# Patient Record
Sex: Female | Born: 1980 | Race: White | Hispanic: No | Marital: Single | Smoking: Never smoker
Health system: Southern US, Community
[De-identification: ages and names within clinical notes are randomized; demographics above are authoritative.]

## PROBLEM LIST (undated history)

## (undated) DIAGNOSIS — I10 Essential (primary) hypertension: Secondary | ICD-10-CM

## (undated) DIAGNOSIS — D6862 Lupus anticoagulant syndrome: Secondary | ICD-10-CM

## (undated) DIAGNOSIS — D649 Anemia, unspecified: Secondary | ICD-10-CM

## (undated) HISTORY — PX: BREAST REDUCTION SURGERY: SHX8

## (undated) HISTORY — PX: OTHER SURGICAL HISTORY: SHX169

## (undated) HISTORY — PX: OVARIAN CYST REMOVAL: SHX89

## (undated) HISTORY — PX: CHOLECYSTECTOMY: SHX55

---

## 2013-07-23 ENCOUNTER — Encounter (HOSPITAL_COMMUNITY): Payer: Self-pay | Admitting: Emergency Medicine

## 2013-07-23 ENCOUNTER — Emergency Department (HOSPITAL_COMMUNITY)
Admission: EM | Admit: 2013-07-23 | Discharge: 2013-07-23 | Discharge status: Against Medical Advice | Payer: Managed Care, Other (non HMO) | Attending: Emergency Medicine | Admitting: Emergency Medicine

## 2013-07-23 DIAGNOSIS — R21 Rash and other nonspecific skin eruption: Secondary | ICD-10-CM | POA: Insufficient documentation

## 2013-07-23 DIAGNOSIS — I10 Essential (primary) hypertension: Secondary | ICD-10-CM | POA: Insufficient documentation

## 2013-07-23 HISTORY — DX: Anemia, unspecified: D64.9

## 2013-07-23 HISTORY — DX: Lupus anticoagulant syndrome: D68.62

## 2013-07-23 HISTORY — DX: Essential (primary) hypertension: I10

## 2013-07-23 NOTE — ED Notes (Signed)
Pt states she has a rash that started on her thighs and has spread  Pt states it itches  States it started about 3 weeks ago

## 2013-08-06 ENCOUNTER — Encounter (HOSPITAL_COMMUNITY): Payer: Self-pay | Admitting: Emergency Medicine

## 2013-08-06 ENCOUNTER — Emergency Department (HOSPITAL_COMMUNITY)
Admission: EM | Admit: 2013-08-06 | Discharge: 2013-08-07 | Disposition: A | Discharge status: Home / Self Care | Payer: Managed Care, Other (non HMO) | Attending: Emergency Medicine | Admitting: Emergency Medicine

## 2013-08-06 DIAGNOSIS — R5381 Other malaise: Secondary | ICD-10-CM | POA: Insufficient documentation

## 2013-08-06 DIAGNOSIS — I10 Essential (primary) hypertension: Secondary | ICD-10-CM | POA: Insufficient documentation

## 2013-08-06 DIAGNOSIS — R5383 Other fatigue: Secondary | ICD-10-CM | POA: Insufficient documentation

## 2013-08-06 DIAGNOSIS — J029 Acute pharyngitis, unspecified: Secondary | ICD-10-CM | POA: Insufficient documentation

## 2013-08-06 DIAGNOSIS — R110 Nausea: Secondary | ICD-10-CM | POA: Insufficient documentation

## 2013-08-06 DIAGNOSIS — Z880 Allergy status to penicillin: Secondary | ICD-10-CM | POA: Insufficient documentation

## 2013-08-06 DIAGNOSIS — K920 Hematemesis: Secondary | ICD-10-CM | POA: Insufficient documentation

## 2013-08-06 DIAGNOSIS — Z862 Personal history of diseases of the blood and blood-forming organs and certain disorders involving the immune mechanism: Secondary | ICD-10-CM | POA: Insufficient documentation

## 2013-08-06 NOTE — ED Notes (Signed)
Pt presents to the ED with symptoms resembling the flu. Pt has a cough, headache, weakness.  Pt denis nasal congestion. Pt has nausea for the past couple of weeks

## 2013-08-06 NOTE — ED Provider Notes (Signed)
CSN: [id number]     Arrival date & time 08/06/13  2259 History  This chart was scribed for non-physician practitioner Arman FilterGail K Atom Solivan, NP, working with Olivia Mackielga M Otter, MD, by Yevette EdwardsAngela Bracken, ED Scribe. This patient was seen in room WTR6/WTR6 and the patient's care was started at 11:25 PM.   First MD Initiated Contact with Patient 08/06/13 2322     Chief Complaint  Patient presents with  . Cough  . Weakness    The history is provided by the patient. No language interpreter was used.   HPI Comments: Kristen Tate is a 33 y.o. female who presents to the Emergency Department complaining of a sore throat which began yesterday evening and which has been associated with a headache, chills, hematemesis, and generalized weakness. She has experienced the headache for several days, and she has experienced a couple weeks of nausea. The pt has not treated her symptoms with anything. She denies nasal congestion, fever, or emesis. She has been exposed to the public.   Past Medical History  Diagnosis Date  . Hypertension   . Anemia   . Lupus anticoagulant disorder    Past Surgical History  Procedure Laterality Date  . Cholecystectomy    . Scar tissue removed     . Breast reduction surgery     Family History  Problem Relation Age of Onset  . Cancer Mother   . Hypertension Mother   . Diabetes Father   . Hypertension Father    History  Substance Use Topics  . Smoking status: Never Smoker   . Smokeless tobacco: Not on file  . Alcohol Use: No   No OB history provided.  Review of Systems  Constitutional: Positive for chills. Negative for fever.  HENT: Negative for congestion.   Respiratory: Positive for cough.   Gastrointestinal: Negative for vomiting.  Neurological: Positive for weakness and headaches.  All other systems reviewed and are negative.    Allergies  Penicillins  Home Medications   Current Outpatient Rx  Name  Route  Sig  Dispense  Refill  . ibuprofen (ADVIL,MOTRIN) 600  MG tablet   Oral   Take 1 tablet (600 mg total) by mouth every 6 (six) hours as needed.   30 tablet   0     Triage Vitals: BP 161/95  Pulse 91  Temp(Src) 98.8 F (37.1 C) (Oral)  Resp 20  Ht 5\' 11"  (1.803 m)  Wt 235 lb (106.595 kg)  BMI 32.79 kg/m2  SpO2 99%  LMP 06/12/2013  Physical Exam  Nursing note and vitals reviewed. Constitutional: She is oriented to person, place, and time. She appears well-developed and well-nourished. No distress.  HENT:  Head: Normocephalic and atraumatic.  Right Ear: External ear normal.  Left Ear: External ear normal.  Nose: Nose normal.  Mouth/Throat: Oropharynx is clear and moist. No oropharyngeal exudate.  Eyes: EOM are normal.  Neck: Neck supple. No tracheal deviation present.  No lymphadenopathy present, but the pt is tender.   Cardiovascular: Normal rate, regular rhythm and normal heart sounds.   No murmur heard. Pulmonary/Chest: Effort normal and breath sounds normal. No respiratory distress. She has no wheezes.  Musculoskeletal: Normal range of motion.  Neurological: She is alert and oriented to person, place, and time.  Skin: Skin is warm and dry.  Psychiatric: She has a normal mood and affect. Her behavior is normal.    ED Course  Procedures (including critical care time)  DIAGNOSTIC STUDIES: Oxygen Saturation is 99% on room air,  normal by my interpretation.    COORDINATION OF CARE:  11:29 PM- Discussed treatment plan with patient, which includes a strep culture, and the patient agreed to the plan.   Labs Review Labs Reviewed  RAPID STREP SCREEN  CULTURE, GROUP A STREP   Imaging Review No results found.   EKG Interpretation None     Strep test is negative Patient has been unable to produce any "bloody sputum"  MDM   Final diagnoses:  Pharyngitis      I personally performed the services described in this documentation, which was scribed in my presence. The recorded information has been reviewed and is  accurate.   Arman Filter, NP 08/07/13 [phone]

## 2013-08-07 LAB — RAPID STREP SCREEN (MED CTR MEBANE ONLY): STREPTOCOCCUS, GROUP A SCREEN (DIRECT): NEGATIVE

## 2013-08-07 MED ORDER — KETOROLAC TROMETHAMINE 15 MG/ML IJ SOLN
30.0000 mg | Freq: Once | INTRAMUSCULAR | Status: AC
  Administered 2013-08-07: 30 mg via INTRAMUSCULAR
  Filled 2013-08-07: qty 2

## 2013-08-07 MED ORDER — IBUPROFEN 600 MG PO TABS: 600.0000 mg | ORAL_TABLET | Freq: Four times a day (QID) | ORAL | Status: DC | PRN

## 2013-08-07 NOTE — Discharge Instructions (Signed)
Your strep test is negative. °

## 2013-08-07 NOTE — ED Provider Notes (Signed)
Medical screening examination/treatment/procedure(s) were performed by non-physician practitioner and as supervising physician I was immediately available for consultation/collaboration.   EKG Interpretation None       Fernado Brigante M Enrico Eaddy, MD 08/07/13 0714 

## 2013-08-08 LAB — CULTURE, GROUP A STREP

## 2013-09-21 ENCOUNTER — Encounter (HOSPITAL_COMMUNITY): Payer: Self-pay | Admitting: Emergency Medicine

## 2013-09-21 ENCOUNTER — Emergency Department (HOSPITAL_COMMUNITY)
Admission: EM | Admit: 2013-09-21 | Discharge: 2013-09-21 | Disposition: A | Discharge status: Home / Self Care | Payer: Managed Care, Other (non HMO) | Attending: Emergency Medicine | Admitting: Emergency Medicine

## 2013-09-21 DIAGNOSIS — I10 Essential (primary) hypertension: Secondary | ICD-10-CM | POA: Insufficient documentation

## 2013-09-21 DIAGNOSIS — Z880 Allergy status to penicillin: Secondary | ICD-10-CM | POA: Insufficient documentation

## 2013-09-21 DIAGNOSIS — L509 Urticaria, unspecified: Secondary | ICD-10-CM | POA: Insufficient documentation

## 2013-09-21 DIAGNOSIS — Z862 Personal history of diseases of the blood and blood-forming organs and certain disorders involving the immune mechanism: Secondary | ICD-10-CM | POA: Insufficient documentation

## 2013-09-21 DIAGNOSIS — R21 Rash and other nonspecific skin eruption: Secondary | ICD-10-CM | POA: Insufficient documentation

## 2013-09-21 MED ORDER — RANITIDINE HCL 150 MG PO TABS: 150.0000 mg | ORAL_TABLET | Freq: Two times a day (BID) | ORAL | Status: DC

## 2013-09-21 MED ORDER — FAMOTIDINE 20 MG PO TABS
20.0000 mg | ORAL_TABLET | Freq: Once | ORAL | Status: AC
  Administered 2013-09-21: 20 mg via ORAL
  Filled 2013-09-21: qty 1

## 2013-09-21 MED ORDER — PREDNISONE 20 MG PO TABS
60.0000 mg | ORAL_TABLET | Freq: Once | ORAL | Status: AC
  Administered 2013-09-21: 60 mg via ORAL
  Filled 2013-09-21: qty 3

## 2013-09-21 MED ORDER — DIPHENHYDRAMINE HCL 25 MG PO TABS: 25.0000 mg | ORAL_TABLET | Freq: Four times a day (QID) | ORAL | Status: DC | PRN

## 2013-09-21 MED ORDER — DIPHENHYDRAMINE HCL 25 MG PO CAPS
25.0000 mg | ORAL_CAPSULE | Freq: Once | ORAL | Status: AC
  Administered 2013-09-21: 25 mg via ORAL
  Filled 2013-09-21: qty 1

## 2013-09-21 MED ORDER — PREDNISONE 20 MG PO TABS: ORAL_TABLET | ORAL | Status: DC

## 2013-09-21 NOTE — ED Notes (Signed)
Pt c/o hives over upper body x 2 days; itching worse today

## 2013-09-21 NOTE — ED Provider Notes (Signed)
CSN: [id number]     Arrival date & time 09/21/13  0751 History   First MD Initiated Contact with Patient 09/21/13 0757     Chief Complaint  Patient presents with  . Urticaria     (Consider location/radiation/quality/duration/timing/severity/associated sxs/prior Treatment) HPI Comments: Denies new contacts, insect bites. She was on Bactrim for a skin infection, but finished this about 1 week ago.   Patient is a 33 y.o. female presenting with urticaria. The history is provided by the patient. No language interpreter was used.  Urticaria This is a new problem. The current episode started yesterday. The problem occurs constantly. The problem has been gradually worsening. Pertinent negatives include no chest pain, no abdominal pain, no headaches and no shortness of breath. Nothing aggravates the symptoms. Nothing relieves the symptoms. She has tried nothing for the symptoms. The treatment provided no relief.    Past Medical History  Diagnosis Date  . Hypertension   . Anemia   . Lupus anticoagulant disorder    Past Surgical History  Procedure Laterality Date  . Cholecystectomy    . Scar tissue removed     . Breast reduction surgery     Family History  Problem Relation Age of Onset  . Cancer Mother   . Hypertension Mother   . Diabetes Father   . Hypertension Father    History  Substance Use Topics  . Smoking status: Never Smoker   . Smokeless tobacco: Not on file  . Alcohol Use: No   OB History   Grav Para Term Preterm Abortions TAB SAB Ect Mult Living                 Review of Systems  Constitutional: Negative for fever, chills, diaphoresis, activity change, appetite change and fatigue.  HENT: Negative for congestion, facial swelling, rhinorrhea and sore throat.   Eyes: Negative for photophobia and discharge.  Respiratory: Negative for cough, chest tightness and shortness of breath.   Cardiovascular: Negative for chest pain, palpitations and leg swelling.    Gastrointestinal: Negative for nausea, vomiting, abdominal pain and diarrhea.  Endocrine: Negative for polydipsia and polyuria.  Genitourinary: Negative for dysuria, frequency, difficulty urinating and pelvic pain.  Musculoskeletal: Negative for arthralgias, back pain, neck pain and neck stiffness.  Skin: Positive for rash. Negative for color change and wound.  Allergic/Immunologic: Negative for immunocompromised state.  Neurological: Negative for facial asymmetry, weakness, numbness and headaches.  Hematological: Does not bruise/bleed easily.  Psychiatric/Behavioral: Negative for confusion and agitation.      Allergies  Penicillins  Home Medications   Prior to Admission medications   Medication Sig Start Date End Date Taking? Authorizing Provider  ibuprofen (ADVIL,MOTRIN) 600 MG tablet Take 1 tablet (600 mg total) by mouth every 6 (six) hours as needed. 08/07/13   Arman FilterGail K Schulz, NP   BP 157/97  Pulse 85  Temp(Src) 97.7 F (36.5 C) (Oral)  Resp 16  SpO2 100%  LMP 09/07/2013 Physical Exam  Constitutional: She is oriented to person, place, and time. She appears well-developed and well-nourished. No distress.  HENT:  Head: Normocephalic and atraumatic.  Mouth/Throat: No oropharyngeal exudate.  Eyes: Pupils are equal, round, and reactive to light.  Neck: Normal range of motion. Neck supple.  Cardiovascular: Normal rate, regular rhythm and normal heart sounds.  Exam reveals no gallop and no friction rub.   No murmur heard. Pulmonary/Chest: Effort normal and breath sounds normal. No respiratory distress. She has no wheezes. She has no rales.  Abdominal: Soft. Bowel  sounds are normal. She exhibits no distension and no mass. There is no tenderness. There is no rebound and no guarding.  Musculoskeletal: Normal range of motion. She exhibits no edema and no tenderness.  Neurological: She is alert and oriented to person, place, and time.  Skin: Skin is warm and dry. Rash noted. Rash is  urticarial.     Generalized urticarial rash  Psychiatric: She has a normal mood and affect.    ED Course  Procedures (including critical care time) Labs Review Labs Reviewed - No data to display  Imaging Review No results found.   EKG Interpretation None      MDM   Final diagnoses:  Urticaria    Pt is a 33 y.o. female with Pmhx as above who presents with 2 days of generalized pruritic urticaria of unknown cause.  No s/sx of anaphylaxis.  VSS, pt in NAD. Will treat w/ 5D benadryl, zantac, prednisone. Return precautions given for new or worsening symptoms including s/sx on anaphylaxis which were discussed. Resources given to est w/ PCP.         Shanna CiscoMegan E Docherty, MD 09/21/13 [phone]

## 2013-09-21 NOTE — Discharge Instructions (Signed)
Hives Hives are itchy, red, swollen areas of the skin. They can vary in size and location on your body. Hives can come and go for hours or several days (acute hives) or for several weeks (chronic hives). Hives do not spread from person to person (noncontagious). They may get worse with scratching, exercise, and emotional stress. CAUSES   Allergic reaction to food, additives, or drugs.  Infections, including the common cold.  Illness, such as vasculitis, lupus, or thyroid disease.  Exposure to sunlight, heat, or cold.  Exercise.  Stress.  Contact with chemicals. SYMPTOMS   Red or white swollen patches on the skin. The patches may change size, shape, and location quickly and repeatedly.  Itching.  Swelling of the hands, feet, and face. This may occur if hives develop deeper in the skin. DIAGNOSIS  Your caregiver can usually tell what is wrong by performing a physical exam. Skin or blood tests may also be done to determine the cause of your hives. In some cases, the cause cannot be determined. TREATMENT  Mild cases usually get better with medicines such as antihistamines. Severe cases may require an emergency epinephrine injection. If the cause of your hives is known, treatment includes avoiding that trigger.  HOME CARE INSTRUCTIONS   Avoid causes that trigger your hives.  Take antihistamines as directed by your caregiver to reduce the severity of your hives. Non-sedating or low-sedating antihistamines are usually recommended. Do not drive while taking an antihistamine.  Take any other medicines prescribed for itching as directed by your caregiver.  Wear loose-fitting clothing.  Keep all follow-up appointments as directed by your caregiver. SEEK MEDICAL CARE IF:   You have persistent or severe itching that is not relieved with medicine.  You have painful or swollen joints. SEEK IMMEDIATE MEDICAL CARE IF:   You have a fever.  Your tongue or lips are swollen.  You have  trouble breathing or swallowing.  You feel tightness in the throat or chest.  You have abdominal pain. These problems may be the first sign of a life-threatening allergic reaction. Call your local emergency services (911 in U.S.). MAKE SURE YOU:   Understand these instructions.  Will watch your condition.  Will get help right away if you are not doing well or get worse. Document Released: 05/18/2005 Document Revised: 11/17/2011 Document Reviewed: 08/11/2011 [facility] Patient Information 2014 [place], [place].   Emergency Department Resource Guide 1) Find a Doctor and Pay Out of Pocket Although you won't have to find out who is covered by your insurance plan, it is a good idea to ask around and get recommendations. You will then need to call the office and see if the doctor you have chosen will accept you as a new patient and what types of options they offer for patients who are self-pay. Some doctors offer discounts or will set up payment plans for their patients who do not have insurance, but you will need to ask so you aren't surprised when you get to your appointment.  2) Contact Your Local Health Department Not all health departments have doctors that can see patients for sick visits, but many do, so it is worth a call to see if yours does. If you don't know where your local health department is, you can check in your phone book. The CDC also has a tool to help you locate your state's health department, and many state websites also have listings of all of their local health departments.  3) Find a [employer]  If your illness is not likely to be very severe or complicated, you may want to try a walk in clinic. These are popping up all over the country in pharmacies, drugstores, and shopping centers. They're usually staffed by nurse practitioners or physician assistants that have been trained to treat common illnesses and complaints. They're usually fairly quick and inexpensive. However,  if you have serious medical issues or chronic medical problems, these are probably not your best option.  No Primary Care Doctor: - Call Health Connect at  [phone] - they can help you locate a primary care doctor that  accepts your insurance, provides certain services, etc. - Physician Referral Service- [phone]  Chronic Pain Problems: Organization         Address  Phone   Notes  [facility]  [phone] Patients need to be referred by their primary care doctor.   Medication Assistance: Organization         Address  Phone   Notes  [facility] Medication [facility] [address] [place], [place] [address] [phone] --Must be a resident of [facility] -- Must have NO insurance coverage whatsoever (no Medicaid/ Medicare, etc.) -- The pt. MUST have a primary care doctor that directs their care regularly and follows them in the community   [facility]  [phone]   [employer]  [phone]    Agencies that provide inexpensive medical care: Organization         Address  Phone   Notes  [facility]  [phone]   [facility]    [phone]   [facility] [address] [place], [place] [address] [phone]   [facility] [address] [place] [phone]   Planned Parenthood    [phone]   [facility]    [phone]   [facility]  [address], [place] Phone:  [phone], Fax:  [phone] Hours of Operation:  9 am - 6 pm, M-F.  Also accepts Medicaid/Medicare and self-pay.  [facility]  [address], [place] Phone: [phone], Fax: [phone]. Hours of Operation:  8:30 am - 5:30 pm, M-F.  Also accepts Medicaid and self-pay.  [facility] [place] [address] [place] Phone: [phone]   [facility] [address] Natasha Bence [place], [place] [phone], Ext. [phone] Mondays & Thursdays: 7-9 AM.  First 15 patients are seen on a first come, first serve basis.    Medicaid-accepting [facility] Providers:  Organization         Address  Phone   Notes  [facility] [address], [place] [phone] Also accepts self-pay patients.  [facility] [address] Laurell Josephs [place], [place]  [phone]   [facility] [address], [place] [phone]   [facility] Medicine [address] [place] [phone]   Renaye Rakers [address], [place]   [phone] Only accepts [place] patients after they have their name applied to their card.   Self-Pay (no insurance) in [facility]:  Organization         Address  Phone   Notes  Sickle Cell Patients, [facility] [address] [place], [place] [phone])  [phone]   [facility] [address], [place] [phone]   [facility]  [address], [place] [phone]   [facility]/Dr. Osei-Bonsu  [address] [place] [address], [place] or [phone], [place] [phone] Phone number for both [place] and [place] locations is the same.  [facility] [address], [place] [phone]   [facility] [address] [place] [address], [place] or [address] [phone]) [phone]   [facility] [address], [place] [phone]) [phone], phone; [phone](336) [phone], fax Sees patients 1st and 3rd Saturday of every month.  Must not qualify for public or private insurance (i.e. Medicaid, Medicare, [place] Health Choice, Veterans' Benefits)  Household income should be no more than 200% of the poverty level The clinic cannot treat you if you are pregnant or think you are  pregnant  Sexually transmitted diseases are not treated at the clinic.    Dental Care: Organization         Address  Phone  Notes  [facility] [address], [place] [phone]) [phone] Accepts children up to age 33 who are enrolled in [place] or [place] Health Choice; pregnant women with a Medicaid card; and children who have applied for Medicaid or [place] Health Choice, but were declined, whose parents can pay a reduced fee at time of service.  [facility] [place]  [address], [place] [phone] Accepts children up to age 33 who are enrolled in [place] or [place] Health Choice; pregnant women with a Medicaid card; and children who have applied for Medicaid or [place] Health Choice, but were declined, whose parents can pay a reduced fee at time of service.  [facility]  [address], [place] [phone]) [phone] Patients are seen by appointment only. Walk-ins are not accepted. [facility] will see patients 33 years of age and older. Monday - Tuesday (8am-5pm) Most Wednesdays (8:30-5pm) $30 per visit, cash only  [facility]  [address], [facility] [place] [phone] Patients are seen by appointment only. Walk-ins are not accepted. [facility] will see patients 33 years of age and older. One Wednesday Evening (Monthly: Volunteer Based).  $30 per visit, cash only  [facility] [facility]  [phone] for adults; Children under age 574, call Graduate Pediatric Dentistry at [phone]. Children aged 294-14, please call [phone] to request a pediatric application.  Dental services are provided in all areas of dental care including fillings, crowns and bridges, complete and partial dentures, implants, gum treatment, root canals, and extractions. Preventive care is also provided. Treatment is provided to both adults and  children. Patients are selected via a lottery and there is often a waiting list.   [facility] [address], [place]  [phone] [email or web address]   [facility] [address], [place], [place] [phone], Ext. [phone] Second and Fourth Thursday of each month, opens at 6:30 AM; Clinic ends at 9 AM.  Patients are seen on a first-come first-served basis, and a limited number are seen during each clinic.   [facility]  [address], [place], [place] [phone]) [phone]   Eligibility Requirements You must have lived in [place], [place], or [place] counties for  at least the last three months.   You cannot be eligible for state or federal sponsored [place] Cityhealthcare insurance, including CIGNAVeterans Administration, [place]icaid, or [employer]Medicare.   You generally cannot be eligible for healthcare insurance through your employer.    How to apply: Eligibility screenings are held every Tuesday and Wednesday afternoon from 1:00 pm until 4:00 pm. You do not need an appointment for the interview!  [facility] [address], [place], [place] [phone]   [facility]  [phone]   [facility]  [phone]   [facility]  [phone]    Behavioral Health Resources in the Community: Intensive Outpatient Programs Organization         Address  Phone  Notes  [facility] [address], [place], [place] [phone]   [facility] [address], [place], [place] [phone]   [facility] [address], [place], [place]  [phone]   [facility] [address],  [place], [place] [phone] or [phone]   Substance Abuse Resources Organization         Address  Phone  Notes  [facility]  [phone]   [facility]  [phone]   [facility]  [phone]    [facility]  [phone]   Residential & Outpatient Substance Abuse Program  [phone]   Psychological Services Organization         Address  Phone  Notes  [facility]  [phone]- [phone]   [facility]  [phone]- [phone]   [facility] [address], [place] [phone] or [phone]    Mobile Crisis Teams Organization         Address  Phone  Notes  [facility], Mobile Crisis Care Unit  [phone]   [facility]  [address] [place], [place] [phone]   Doristine LocksSharon DeEsch [address] [place] [phone]    Self-Help/Support Groups Organization         Address  Phone             Notes  [facility] [place] - variety of support groups  336- [phone] Call for more information  Narcotics Anonymous (NA), [facility] [address], [place]  2 meetings at this location   Statisticianesidential Treatment Programs Organization         Address  Phone  Notes  [facility] [address],    [place]  [phone]   [facility]  [address], [place] [phone], [place], [place] [phone]   [facility] [address], [place] [phone] Admissions: 8am-3pm M-F  [facility] [address]    [place], [place] [phone]   [facility] [address], [place], [place] [phone]   [facility] [address]  [place], [place] [phone]   [facility] - Intensive Outpatient [address], [place], [place] [phone]   [facility] [address]  [place], [place] [phone] or [phone]   [facility] ([facility]) [address] [place], [place] [phone] Accepts Medicaid  [facility] [address]  [place] [phone] Substance Abuse/Addiction Treatment   [facility]  Behavioral Health Resources Organization         Address  Phone  Notes  [facility]  [phone]  [phone]   °Julie Brannon, PhD [address] [place], [place]   [phone] or [phone]   °[facility]   [address] °[place], [place] [phone]   °[facility] [address], [place], [place] [phone] Insurance/Medicaid/sponsorship through [facility]  °[facility] [address]                                    [place], [place] [phone] Therapy/tele-psych/case  °[facility] [address]  ° [place], [place] [phone]    °Dr. Arfeen  [phone]   °[facility] [place]  [employer] [place] [facility] 1) [address], [place] °2) [address], [place] °3)  [address], [place] [phone] °([phone] ° °([phone]   °[place] Child Abuse Hotline [phone]) [phone] or [phone]) [phone] (After Hours)    ° ° ° °

## 2013-10-19 ENCOUNTER — Emergency Department (HOSPITAL_COMMUNITY)
Admission: EM | Admit: 2013-10-19 | Discharge: 2013-10-19 | Disposition: A | Discharge status: Home / Self Care | Payer: Managed Care, Other (non HMO) | Attending: Emergency Medicine | Admitting: Emergency Medicine

## 2013-10-19 ENCOUNTER — Encounter (HOSPITAL_COMMUNITY): Payer: Self-pay | Admitting: Emergency Medicine

## 2013-10-19 DIAGNOSIS — Z880 Allergy status to penicillin: Secondary | ICD-10-CM | POA: Insufficient documentation

## 2013-10-19 DIAGNOSIS — I10 Essential (primary) hypertension: Secondary | ICD-10-CM | POA: Insufficient documentation

## 2013-10-19 DIAGNOSIS — R21 Rash and other nonspecific skin eruption: Secondary | ICD-10-CM | POA: Insufficient documentation

## 2013-10-19 DIAGNOSIS — Z202 Contact with and (suspected) exposure to infections with a predominantly sexual mode of transmission: Secondary | ICD-10-CM | POA: Insufficient documentation

## 2013-10-19 DIAGNOSIS — Z862 Personal history of diseases of the blood and blood-forming organs and certain disorders involving the immune mechanism: Secondary | ICD-10-CM | POA: Insufficient documentation

## 2013-10-19 DIAGNOSIS — Z3202 Encounter for pregnancy test, result negative: Secondary | ICD-10-CM | POA: Insufficient documentation

## 2013-10-19 LAB — WET PREP, GENITAL: Yeast Wet Prep HPF POC: NONE SEEN

## 2013-10-19 LAB — HIV ANTIBODY (ROUTINE TESTING W REFLEX): HIV 1&2 Ab, 4th Generation: NONREACTIVE

## 2013-10-19 LAB — PREGNANCY, URINE: Preg Test, Ur: NEGATIVE

## 2013-10-19 LAB — GC/CHLAMYDIA PROBE AMP
CT Probe RNA: NEGATIVE
GC PROBE AMP APTIMA: NEGATIVE

## 2013-10-19 LAB — RPR

## 2013-10-19 MED ORDER — DOXYCYCLINE HYCLATE 100 MG PO TABS: 100.0000 mg | ORAL_TABLET | Freq: Once | ORAL | Status: DC

## 2013-10-19 MED ORDER — PREDNISONE 20 MG PO TABS
60.0000 mg | ORAL_TABLET | Freq: Once | ORAL | Status: AC
  Administered 2013-10-19: 60 mg via ORAL
  Filled 2013-10-19: qty 3

## 2013-10-19 MED ORDER — AZITHROMYCIN 250 MG PO TABS
1000.0000 mg | ORAL_TABLET | Freq: Every day | ORAL | Status: DC
  Administered 2013-10-19: 1000 mg via ORAL
  Filled 2013-10-19: qty 4

## 2013-10-19 MED ORDER — DOXYCYCLINE HYCLATE 100 MG PO TABS: 100.0000 mg | ORAL_TABLET | Freq: Two times a day (BID) | ORAL | Status: AC

## 2013-10-19 NOTE — ED Notes (Signed)
Pt left prior to receiving discharge papers, prior to discharge vitals, and prior to receiving medications that were due at discharge.

## 2013-10-19 NOTE — ED Provider Notes (Signed)
CSN: [id number]     Arrival date & time 10/19/13  0019 History   First MD Initiated Contact with Patient 10/19/13 0056     Chief Complaint  Patient presents with  . Wound Check     (Consider location/radiation/quality/duration/timing/severity/associated sxs/prior Treatment) HPI Comments: Started with hives 2 weeks ago that has progressed to lesions that are itch and patient has been unable to stop picking at them. Also wants to be checked for STD as previous boyfriend, who she had intercourse with 1 week ago called and told her he had chlamydia.  Patient is a 33 y.o. female presenting with wound check. The history is provided by the patient.  Wound Check The current episode started 1 to 4 weeks ago. The problem occurs constantly. The problem has been unchanged. Pertinent negatives include no fever or headaches. Nothing aggravates the symptoms. Treatments tried: benadryl. The treatment provided no relief.    Past Medical History  Diagnosis Date  . Hypertension   . Anemia   . Lupus anticoagulant disorder    Past Surgical History  Procedure Laterality Date  . Cholecystectomy    . Scar tissue removed     . Breast reduction surgery     Family History  Problem Relation Age of Onset  . Cancer Mother   . Hypertension Mother   . Diabetes Father   . Hypertension Father    History  Substance Use Topics  . Smoking status: Never Smoker   . Smokeless tobacco: Not on file  . Alcohol Use: No   OB History   Grav Para Term Preterm Abortions TAB SAB Ect Mult Living                 Review of Systems  Constitutional: Negative for fever.  Genitourinary: Positive for vaginal discharge. Negative for dysuria.  Skin: Positive for wound.  Neurological: Negative for dizziness and headaches.      Allergies  Penicillins  Home Medications   Prior to Admission medications   Medication Sig Start Date End Date Taking? Authorizing Provider  diphenhydrAMINE (BENADRYL) 25 MG tablet Take 25  mg by mouth at bedtime as needed for itching. 09/21/13  Yes Shanna CiscoMegan E Docherty, MD   BP 142/97  Pulse 92  Temp(Src) 98.4 F (36.9 C) (Oral)  Resp 18  SpO2 100%  LMP 09/27/2013 Physical Exam  Nursing note and vitals reviewed. Constitutional: She appears well-nourished.  HENT:  Head: Normocephalic.  Eyes: Pupils are equal, round, and reactive to light.  Cardiovascular: Normal rate and regular rhythm.   Pulmonary/Chest: Effort normal.  Abdominal: Soft. She exhibits no distension. There is no tenderness.  Genitourinary: Uterus is not enlarged and not tender. Cervix exhibits discharge. Right adnexum displays no tenderness. Left adnexum displays no tenderness. No tenderness or bleeding around the vagina. Vaginal discharge found.  Musculoskeletal: Normal range of motion.  Neurological: She is alert.  Skin:  Multiple scabbed over lesions on legs, arms, chest, and abdomen, and across the top of the shoulders, and he placed.  The patient.  Can reach to scratch there are no lesions.  On the lower back    ED Course  Procedures (including critical care time) Labs Review Labs Reviewed  WET PREP, GENITAL - Abnormal; Notable for the following:    Trich, Wet Prep MODERATE (*)    Clue Cells Wet Prep HPF POC RARE (*)    WBC, Wet Prep HPF POC MODERATE (*)    All other components within normal limits  GC/CHLAMYDIA PROBE AMP  PREGNANCY, URINE  RPR  HIV ANTIBODY (ROUTINE TESTING)    Imaging Review No results found.   EKG Interpretation None      MDM   Final diagnoses:  Rash  Exposure to STD        Arman FilterGail K Mehlani Blankenburg, NP 10/19/13 [id number]

## 2013-10-19 NOTE — Discharge Instructions (Signed)
Your cultures are pending Please take the antibiotic as directed until completed

## 2013-10-19 NOTE — ED Provider Notes (Signed)
Medical screening examination/treatment/procedure(s) were performed by non-physician practitioner and as supervising physician I was immediately available for consultation/collaboration.   EKG Interpretation None       Olivia Mackielga M Jayan Raymundo, MD 10/19/13 [phone]

## 2013-10-19 NOTE — ED Notes (Signed)
Pt presents with scabbed wounds to arms, legs and torso x 2 weeks. Pt states they began as hives

## 2013-10-30 ENCOUNTER — Telehealth (HOSPITAL_BASED_OUTPATIENT_CLINIC_OR_DEPARTMENT_OTHER): Payer: Self-pay | Admitting: Emergency Medicine

## 2014-10-06 ENCOUNTER — Encounter (HOSPITAL_BASED_OUTPATIENT_CLINIC_OR_DEPARTMENT_OTHER): Payer: Self-pay

## 2014-10-06 ENCOUNTER — Emergency Department (HOSPITAL_BASED_OUTPATIENT_CLINIC_OR_DEPARTMENT_OTHER)
Admission: EM | Admit: 2014-10-06 | Discharge: 2014-10-06 | Disposition: A | Discharge status: Home / Self Care | Payer: Managed Care, Other (non HMO) | Attending: Emergency Medicine | Admitting: Emergency Medicine

## 2014-10-06 DIAGNOSIS — D649 Anemia, unspecified: Secondary | ICD-10-CM | POA: Insufficient documentation

## 2014-10-06 DIAGNOSIS — Z792 Long term (current) use of antibiotics: Secondary | ICD-10-CM | POA: Insufficient documentation

## 2014-10-06 DIAGNOSIS — Z79899 Other long term (current) drug therapy: Secondary | ICD-10-CM | POA: Insufficient documentation

## 2014-10-06 DIAGNOSIS — Z3202 Encounter for pregnancy test, result negative: Secondary | ICD-10-CM | POA: Insufficient documentation

## 2014-10-06 DIAGNOSIS — R55 Syncope and collapse: Secondary | ICD-10-CM

## 2014-10-06 DIAGNOSIS — I10 Essential (primary) hypertension: Secondary | ICD-10-CM | POA: Insufficient documentation

## 2014-10-06 DIAGNOSIS — Z880 Allergy status to penicillin: Secondary | ICD-10-CM | POA: Insufficient documentation

## 2014-10-06 LAB — BASIC METABOLIC PANEL
Anion gap: 7 (ref 5–15)
BUN: 12 mg/dL (ref 6–20)
CALCIUM: 8 mg/dL — AB (ref 8.9–10.3)
CO2: 25 mmol/L (ref 22–32)
CREATININE: 1.05 mg/dL — AB (ref 0.44–1.00)
Chloride: 108 mmol/L (ref 101–111)
GFR calc Af Amer: >60 mL/min (ref >60)
GLUCOSE: 106 mg/dL — AB (ref 70–99)
Potassium: 3.6 mmol/L (ref 3.5–5.1)
Sodium: 140 mmol/L (ref 135–145)

## 2014-10-06 LAB — CBC
HCT: 32.3 % — ABNORMAL LOW (ref 36.0–46.0)
HEMOGLOBIN: 9.6 g/dL — AB (ref 12.0–15.0)
MCH: 18.7 pg — ABNORMAL LOW (ref 26.0–34.0)
MCHC: 29.7 g/dL — ABNORMAL LOW (ref 30.0–36.0)
MCV: 63 fL — ABNORMAL LOW (ref 78.0–100.0)
Platelets: 388 10*3/uL (ref 150–400)
RBC: 5.13 MIL/uL — ABNORMAL HIGH (ref 3.87–5.11)
RDW: 19.5 % — ABNORMAL HIGH (ref 11.5–15.5)
WBC: 12.3 10*3/uL — AB (ref 4.0–10.5)

## 2014-10-06 LAB — PREGNANCY, URINE: PREG TEST UR: NEGATIVE

## 2014-10-06 MED ORDER — FERROUS SULFATE 325 (65 FE) MG PO TABS: 325.0000 mg | ORAL_TABLET | Freq: Every day | ORAL | Status: AC

## 2014-10-06 NOTE — ED Provider Notes (Signed)
CSN: [id number]     Arrival date & time 10/06/14  1703 History   First MD Initiated Contact with Patient 10/06/14 1728     Chief Complaint  Patient presents with  . Loss of Consciousness     (Consider location/radiation/quality/duration/timing/severity/associated sxs/prior Treatment) HPI  Kristen Tate is a 34 y.o. female with PMH of HTN, anemia, lupus presenting with syncopal episode while at work today. Pt states she was standing and felt flushed and lightheaded with bilateral numbness tingling in fingers and walked to bathroom to splash water on her face and loss consciousness. Episode was witnessed at work staff. No head injury. Patient does report giving plasma today without eating or drinking. Patient endorses mild headache that developed gradually and is like other headaches he's had before. No numbness, tingling, weakness. Patient with mild bilateral neck pain. No chest pain or shortness of breath. Patient denies any nausea or vomiting abdominal back pain. No change in medications or new medications.   Past Medical History  Diagnosis Date  . Hypertension   . Anemia   . Lupus anticoagulant disorder    Past Surgical History  Procedure Laterality Date  . Cholecystectomy    . Scar tissue removed     . Breast reduction surgery     Family History  Problem Relation Age of Onset  . Cancer Mother   . Hypertension Mother   . Diabetes Father   . Hypertension Father    History  Substance Use Topics  . Smoking status: Never Smoker   . Smokeless tobacco: Not on file  . Alcohol Use: No   OB History    No data available     Review of Systems 10 Systems reviewed and are negative for acute change except as noted in the HPI.    Allergies  Penicillins  Home Medications   Prior to Admission medications   Medication Sig Start Date End Date Taking? Authorizing Provider  LISINOPRIL PO Take by mouth.   Yes Historical Provider, MD  diphenhydrAMINE (BENADRYL) 25 MG tablet Take  25 mg by mouth at bedtime as needed for itching. 09/21/13   Toy CookeyMegan Docherty, MD  doxycycline (VIBRA-TABS) 100 MG tablet Take 1 tablet (100 mg total) by mouth 2 (two) times daily. 10/19/13   Earley FavorGail Schulz, NP  ferrous sulfate 325 (65 FE) MG tablet Take 1 tablet (325 mg total) by mouth daily. 10/06/14   Oswaldo ConroyVictoria Lenea Bywater, PA-C   BP 108/75 mmHg  Pulse 77  Temp(Src) 98.3 F (36.8 C) (Oral)  Resp 14  Ht 5\' 10"  (1.778 m)  Wt 230 lb (104.327 kg)  BMI 33.00 kg/m2  SpO2 100% Physical Exam  Constitutional: She appears well-developed and well-nourished. No distress.  HENT:  Head: Normocephalic and atraumatic.  Mouth/Throat: Oropharynx is clear and moist.  Eyes: Conjunctivae and EOM are normal. Pupils are equal, round, and reactive to light. Right eye exhibits no discharge. Left eye exhibits no discharge.  Neck: Normal range of motion. Neck supple.  No nuchal rigidity  Cardiovascular: Normal rate and regular rhythm.   Pulmonary/Chest: Effort normal and breath sounds normal. No respiratory distress. She has no wheezes.  Abdominal: Soft. Bowel sounds are normal. She exhibits no distension. There is no tenderness.  Neurological: She is alert. No cranial nerve deficit. Coordination normal.  Speech is clear and goal oriented. Peripheral visual fields intact. Strength 5/5 in upper and lower extremities. Sensation intact. Intact rapid alternating movements, finger to nose, and heel to shin. Negative Romberg. No pronator drift. Normal gait.  Skin: Skin is warm and dry. She is not diaphoretic.  Nursing note and vitals reviewed.   ED Course  Procedures (including critical care time) Labs Review Labs Reviewed  CBC - Abnormal; Notable for the following:    WBC 12.3 (*)    RBC 5.13 (*)    Hemoglobin 9.6 (*)    HCT 32.3 (*)    MCV 63.0 (*)    MCH 18.7 (*)    MCHC 29.7 (*)    RDW 19.5 (*)    All other components within normal limits  BASIC METABOLIC PANEL - Abnormal; Notable for the following:    Glucose,  Bld 106 (*)    Creatinine, Ser 1.05 (*)    Calcium 8.0 (*)    All other components within normal limits  PREGNANCY, URINE    Imaging Review No results found.   EKG Interpretation None      MDM   Final diagnoses:  Syncope, unspecified syncope type  Anemia, unspecified anemia type   Patient presenting after syncopal episode without any current symptoms. VSS. No hypotension in ED. Pt given 1 L NS. Neurological exam without deficits. Pt with syncopal episode after anorexia and giving plasma which are likely the cause. Pt also with anemia which is chronic for her per patient. Pt given iron supplementation. I doubt this is contributing. Pt denies heavy vaginal bleeding, melenotic stool, hematemesis. Pt not pregnant. EKG without evidence of ischemia or arrhythmia. Pt states she feels much better after eating and drinking in ED. Pt well appearing, nontoxic and stable for discharge with PCP follow up. Pt given referral to [facility].  Discussed return precautions with patient. Discussed all results and patient verbalizes understanding and agrees with plan.  Filed Vitals:   10/06/14 1711 10/06/14 1916 10/06/14 1934  BP: 117/71 108/75 113/74  Pulse: 78 77 89  Temp: 97.6 F (36.4 C) 98.3 F (36.8 C)   TempSrc: Oral Oral   Resp: 14  18  Height: 5\' 10"  (1.778 m)    Weight: 230 lb (104.327 kg)    SpO2: 100% 100% 100%        Oswaldo ConroyVictoria Nylia Gavina, PA-C 10/06/14 1936  Raeford RazorStephen Kohut, MD 10/06/14 2023

## 2014-10-06 NOTE — Discharge Instructions (Signed)
Return to the emergency room with worsening of symptoms, new symptoms or with symptoms that are concerning , especially severe worsening of headache, visual or speech changes, weakness in face, arms or legs. Take iron pills daily. If it upsets your stomach and causes constipation take every other day. Please call your doctor for a followup appointment within 24-48 hours. When you talk to your doctor please let them know that you were seen in the emergency department and have them acquire all of your records so that they can discuss the findings with you and formulate a treatment plan to fully care for your new and ongoing problems. If you do not have a primary care provider please call the number below under ED resources to establish care with a provider and follow up.   Syncope Syncope is a medical term for fainting or passing out. This means you lose consciousness and drop to the ground. People are generally unconscious for less than 5 minutes. You may have some muscle twitches for up to 15 seconds before waking up and returning to normal. Syncope occurs more often in older adults, but it can happen to anyone. While most causes of syncope are not dangerous, syncope can be a sign of a serious medical problem. It is important to seek medical care.  CAUSES  Syncope is caused by a sudden drop in blood flow to the brain. The specific cause is often not determined. Factors that can bring on syncope include:  Taking medicines that lower blood pressure.  Sudden changes in posture, such as standing up quickly.  Taking more medicine than prescribed.  Standing in one place for too long.  Seizure disorders.  Dehydration and excessive exposure to heat.  Low blood sugar (hypoglycemia).  Straining to have a bowel movement.  Heart disease, irregular heartbeat, or other circulatory problems.  Fear, emotional distress, seeing blood, or severe pain. SYMPTOMS  Right before fainting, you may:  Feel dizzy  or light-headed.  Feel nauseous.  See all white or all black in your field of vision.  Have cold, clammy skin. DIAGNOSIS  Your health care provider will ask about your symptoms, perform a physical exam, and perform an electrocardiogram (ECG) to record the electrical activity of your heart. Your health care provider may also perform other heart or blood tests to determine the cause of your syncope which may include:  Transthoracic echocardiogram (TTE). During echocardiography, sound waves are used to evaluate how blood flows through your heart.  Transesophageal echocardiogram (TEE).  Cardiac monitoring. This allows your health care provider to monitor your heart rate and rhythm in real time.  Holter monitor. This is a portable device that records your heartbeat and can help diagnose heart arrhythmias. It allows your health care provider to track your heart activity for several days, if needed.  Stress tests by exercise or by giving medicine that makes the heart beat faster. TREATMENT  In most cases, no treatment is needed. Depending on the cause of your syncope, your health care provider may recommend changing or stopping some of your medicines. HOME CARE INSTRUCTIONS  Have someone stay with you until you feel stable.  Do not drive, use machinery, or play sports until your health care provider says it is okay.  Keep all follow-up appointments as directed by your health care provider.  Lie down right away if you start feeling like you might faint. Breathe deeply and steadily. Wait until all the symptoms have passed.  Drink enough fluids to keep your  urine clear or pale yellow.  If you are taking blood pressure or heart medicine, get up slowly and take several minutes to sit and then stand. This can reduce dizziness. SEEK IMMEDIATE MEDICAL CARE IF:   You have a severe headache.  You have unusual pain in the chest, abdomen, or back.  You are bleeding from your mouth or rectum, or  you have black or tarry stool.  You have an irregular or very fast heartbeat.  You have pain with breathing.  You have repeated fainting or seizure-like jerking during an episode.  You faint when sitting or lying down.  You have confusion.  You have trouble walking.  You have severe weakness.  You have vision problems. If you fainted, call your local emergency services (911 in U.S.). Do not drive yourself to the hospital.  MAKE SURE YOU:  Understand these instructions.  Will watch your condition.  Will get help right away if you are not doing well or get worse. Document Released: 05/18/2005 Document Revised: 05/23/2013 Document Reviewed: 07/17/2011 [facility] Patient Information 2015 [place], [place]. This information is not intended to replace advice given to you by your health care provider. Make sure you discuss any questions you have with your health care provider.    Emergency Department Resource Guide 1) Find a Doctor and Pay Out of Pocket Although you won't have to find out who is covered by your insurance plan, it is a good idea to ask around and get recommendations. You will then need to call the office and see if the doctor you have chosen will accept you as a new patient and what types of options they offer for patients who are self-pay. Some doctors offer discounts or will set up payment plans for their patients who do not have insurance, but you will need to ask so you aren't surprised when you get to your appointment.  2) Contact Your Local Health Department Not all health departments have doctors that can see patients for sick visits, but many do, so it is worth a call to see if yours does. If you don't know where your local health department is, you can check in your phone book. The CDC also has a tool to help you locate your state's health department, and many state websites also have listings of all of their local health departments.  3) Find a Walk-in Clinic If  your illness is not likely to be very severe or complicated, you may want to try a walk in clinic. These are popping up all over the country in pharmacies, drugstores, and shopping centers. They're usually staffed by nurse practitioners or physician assistants that have been trained to treat common illnesses and complaints. They're usually fairly quick and inexpensive. However, if you have serious medical issues or chronic medical problems, these are probably not your best option.  No Primary Care Doctor: - Call Health Connect at  [phone] - they can help you locate a primary care doctor that  accepts your insurance, provides certain services, etc. - Physician Referral Service- [phone]  Chronic Pain Problems: Organization         Address  Phone   Notes  [facility]  [phone] Patients need to be referred by their primary care doctor.   Medication Assistance: Organization         Address  Phone   Notes  [facility] Medication [facility] [address] [place], [place] [address] [phone] --Must be  a resident of [facility] -- Must have NO insurance coverage whatsoever (no Medicaid/ Medicare, etc.) -- The pt. MUST have a primary care doctor that directs their care regularly and follows them in the community   [facility]  [phone]   [employer]  [phone]    Agencies that provide inexpensive medical care: Organization         Address  Phone   Notes  [facility]  [phone]   [facility]    [phone]   [facility] [address] [place], [place] [address] [phone]   [facility] [address], [place] [phone]   Planned Parenthood    [phone]   [facility]    [phone]   [facility]  [address], [place] Phone:  [phone], Fax:  [phone] Hours of  Operation:  9 am - 6 pm, M-F.  Also accepts Medicaid/Medicare and self-pay.  [facility]  [address],  Phone: [phone], Fax: [phone]. Hours of Operation:  8:30 am - 5:30 pm, M-F.  Also accepts Medicaid and self-pay.  [facility] [place] [address] [place] Phone: [phone]   [facility] [address] Natasha Bence [place], [place] [phone], Ext. [phone] Mondays & Thursdays: 7-9 AM.  First 15 patients are seen on a first come, first serve basis.    Medicaid-accepting [facility] Providers:  Organization         Address  Phone   Notes  [facility] [address],  [phone] Also accepts self-pay patients.  [facility] [address] Laurell Josephs [place], [place]  [phone]   [facility] [address], [place] [phone]   [facility] [address], [place] [phone]   Renaye Rakers [address], [place]   [phone] Only accepts [place] patients after they have their name applied to their card.   Self-Pay (no insurance) in [facility]:  Organization         Address  Phone   Notes  Sickle Cell Patients, [facility] [address] [place], [place] [phone]   [facility] [address] [place], [place] [phone]   [facility] [place]  [address], [place] [phone]   [facility]/Dr. Osei-Bonsu  [address], [place] or [address], [place] [phone] Phone number for both [place] and [place] locations is the same.  Urgent Medical and [facility] [address], [place] [phone]   [facility] [address] [place] or [address] [phone]   [facility] [address], [place] [phone], phone; [phone], fax Sees patients 1st and 3rd Saturday of every month.  Must not qualify for public or private insurance (i.e. Medicaid, Medicare, [place] Health Choice, Veterans' Benefits)  Household income should be no more than 200% of the poverty level The clinic cannot treat you if you are pregnant or think you are pregnant  Sexually transmitted diseases are not treated at the clinic.    Dental Care: Organization  Address  Phone  Notes  [facility] [address], [place] [phone] Accepts children up to age 221 who are enrolled in [place]icaid or [place] Health Choice; pregnant women with a Medicaid card; and children who have applied for Medicaid or [place] Health Choice, but were declined, whose parents can pay a reduced fee at time of service.  [facility] [place]  [address], [place] [phone] Accepts children up to age 34 who are enrolled in [place] or [place] Health Choice; pregnant women with a Medicaid card; and children who have applied for Medicaid or [place] Health Choice, but were declined, whose parents can pay a reduced fee at time of service.  [facility]  [address], [place] [phone] Patients are seen by appointment only. Walk-ins are not accepted. [facility] will see patients 34 years of age and older. Monday - Tuesday (8am-5pm) Most Wednesdays (8:30-5pm) $30 per visit, cash only  [facility]  [address], [facility] [phone] Patients are seen by appointment only. Walk-ins are not accepted. [facility] will see patients 34 years of age and older. One Wednesday Evening (Monthly: Volunteer Based).  $30 per visit, cash only  [employer] [facility]  [phone] for adults; Children under age 544, call Graduate Pediatric  Dentistry at [phone]. Children aged 674-14, please call [phone] to request a pediatric application.  Dental services are provided in all areas of dental care including fillings, crowns and bridges, complete and partial dentures, implants, gum treatment, root canals, and extractions. Preventive care is also provided. Treatment is provided to both adults and children. Patients are selected via a lottery and there is often a waiting list.   [facility] [address], [place]  [phone] [email or web address]   [facility] [address], [place], [place] [phone], Ext. [phone] Second and Fourth Thursday of each month, opens at 6:30 AM; Clinic ends at 9 AM.  Patients are seen on a first-come first-served basis, and a limited number are seen during each clinic.   [facility]  [address], [place], [place] [phone]   Eligibility Requirements You must have lived in [place], [place], or [place] counties for at least the last three months.   You cannot be eligible for state or federal sponsored National Cityhealthcare insurance, including CIGNAVeterans Administration, [place]icaid, or [employer]icare.   You generally cannot be eligible for healthcare insurance through your employer.    How to apply: Eligibility screenings are held every Tuesday and Wednesday afternoon from 1:00 pm until 4:00 pm. You do not need an appointment for the interview!  [facility] [address], [place], [place] [phone]   [facility]  [phone]   [facility]  [phone]   [facility]  [phone]    Behavioral Health Resources in the Community: Intensive Outpatient Programs Organization         Address  Phone  Notes  [facility] [address], [place], [place] [phone]   [facility] [address], [place], [place] [phone]   ADS:  [facility] [address], [place], [place]  [phone]   [facility] [address],  [place], [place] [phone] or [phone]   Substance Abuse Resources  Organization         Address  Phone  Notes  [facility]  [phone]   [facility]  [phone]   [facility]  [phone]   [facility]  [phone]   Residential & Outpatient Substance Abuse Program  [phone]   Psychological Services Organization         Address  Phone  Notes  [facility]  [phone]- [phone]   [facility]  [phone]- [phone]   [facility] [address], [place] [phone] or [phone]    Mobile Crisis Teams Organization         Address  Phone  Notes  [facility], Mobile Crisis Care Unit  [phone]   [facility]  [address] [place], [place] [phone]   Doristine LocksSharon DeEsch [address] [place] [phone]    Self-Help/Support Groups Organization         Address  Phone             Notes  Mental Health Assoc. of  - variety of support groups  336- [phone] Call for more information  Narcotics Anonymous (NA), [facility] [address], [place]  2 meetings at this location   Statisticianesidential Treatment Programs Organization         Address  Phone  Notes  [facility] [address],    [place]  [phone]   [facility]  [address], [place] [phone], [place], [place] [phone]   [facility] [address], [place] [phone] Admissions: 8am-3pm M-F  [facility] [address]    [place], [place] [phone]   [facility] [address], [place], [place] [phone]   [facility] [address]  [place], [place] [phone]   [facility] - Intensive Outpatient [address],  [place], [place] [phone]   [facility] ([facility] [address]  [place], [place] [phone] or [phone]   [facility] ([facility]) [address] [place], [place] [phone] Accepts Medicaid  [facility] [address]  [place] [phone] Substance Abuse/Addiction Treatment   [facility] Resources Organization         Address  Phone  Notes  [facility]  [phone]   Angie FavaJulie Brannon, PhD [address], [address] [place], [place]   [phone] or [phone]   [facility]   [address] [place], [place] [phone]   [facility] [address], [place], [place] [phone] Insurance/Medicaid/sponsorship through [facility]  [facility] Families [address]                                    [place], [place] [phone] Therapy/tele-psych/case  [facility] [address]   [place], [place] [phone]    Dr. Lolly MustacheArfeen  [phone]   [facility]  [employer] [facility] 1) [address], [place] 2) [address], [place] 3)  [address], [place] [phone]  [phone]   [facility] Child Abuse Hotline [phone] or [phone] (After Hours)

## 2014-10-06 NOTE — ED Notes (Addendum)
EMS reports patient gave plasma today without eating or drinking and had a witnessed syncopal episode at work - denies neck, back pain, does c/o bilateral shoulder tenderness. Initially hypotensive - pt given 1L NS via 18G RAC placed by EMS. CBG 112.

## 2014-10-06 NOTE — ED Provider Notes (Signed)
Medical screening examination/treatment/procedure(s) were performed by non-physician practitioner and as supervising physician I was immediately available for consultation/collaboration.   EKG Interpretation None     EKG:  Rhythm: normal sinus Vent. rate 81 BPM PR interval 172 ms QRS duration 86 ms QT/QTc 406/471 ms ST segments: normal   Raeford RazorStephen Trampus Mcquerry, MD 10/06/14 1836

## 2016-12-20 ENCOUNTER — Encounter (HOSPITAL_COMMUNITY): Payer: Self-pay | Admitting: Emergency Medicine

## 2016-12-20 DIAGNOSIS — Z79899 Other long term (current) drug therapy: Secondary | ICD-10-CM | POA: Insufficient documentation

## 2016-12-20 DIAGNOSIS — M79604 Pain in right leg: Secondary | ICD-10-CM | POA: Insufficient documentation

## 2016-12-20 DIAGNOSIS — I10 Essential (primary) hypertension: Secondary | ICD-10-CM | POA: Insufficient documentation

## 2016-12-20 NOTE — ED Triage Notes (Signed)
Pt presents to ED for assessment of right leg pain after bending down to pick up a 25pack of water today.  Pt states she felt a sharp pop/tear in the back of her calf, and has been having difficulty ambulating on her knee or foot since.  Pt states unable to wiggle toes, 1+ pedal pulse, sensation intact, foot cool to touch.

## 2016-12-21 ENCOUNTER — Emergency Department (HOSPITAL_COMMUNITY): Payer: Self-pay

## 2016-12-21 ENCOUNTER — Emergency Department (HOSPITAL_COMMUNITY)
Admission: EM | Admit: 2016-12-21 | Discharge: 2016-12-21 | Disposition: A | Discharge status: Home / Self Care | Payer: Self-pay | Attending: Emergency Medicine | Admitting: Emergency Medicine

## 2016-12-21 DIAGNOSIS — M79604 Pain in right leg: Secondary | ICD-10-CM

## 2016-12-21 MED ORDER — IBUPROFEN 800 MG PO TABS: 800.0000 mg | ORAL_TABLET | Freq: Three times a day (TID) | ORAL | 0 refills | Status: DC

## 2016-12-21 MED ORDER — HYDROCODONE-ACETAMINOPHEN 5-325 MG PO TABS: 1.0000 | ORAL_TABLET | Freq: Four times a day (QID) | ORAL | 0 refills | Status: AC | PRN

## 2016-12-21 NOTE — ED Provider Notes (Signed)
[facility]-EMERGENCY DEPT Provider Note   CSN: [id number] Arrival date & time: 12/20/16  2339     History   Chief Complaint Chief Complaint  Patient presents with  . Leg Pain    HPI Kristen Tate is a 36 y.o. female.  Patient presents to the ED with a chief complaint of right leg pain.  She states that she was lifting a case of water and felt a pop in her calf.  She states that she has been unable to ambulate due to pain and cannot move her feet due to pain.  She denies any numbness or weakness.  She has not taken anything for her symptoms.  The symptoms are worsened with movement and palpation.     The history is provided by the patient. No language interpreter was used.    Past Medical History:  Diagnosis Date  . Anemia   . Hypertension   . Lupus anticoagulant disorder (HCC)     There are no active problems to display for this patient.   Past Surgical History:  Procedure Laterality Date  . BREAST REDUCTION SURGERY    . CHOLECYSTECTOMY    . scar tissue removed       OB History    No data available       Home Medications    Prior to Admission medications   Medication Sig Start Date End Date Taking? Authorizing Provider  diphenhydrAMINE (BENADRYL) 25 MG tablet Take 25 mg by mouth at bedtime as needed for itching. 09/21/13   Toy Cookeyocherty, Megan, MD  doxycycline (VIBRA-TABS) 100 MG tablet Take 1 tablet (100 mg total) by mouth 2 (two) times daily. 10/19/13   Earley FavorSchulz, Gail, NP  ferrous sulfate 325 (65 FE) MG tablet Take 1 tablet (325 mg total) by mouth daily. 10/06/14   Oswaldo Conroyreech, Victoria, PA-C  HYDROcodone-acetaminophen (NORCO/VICODIN) 5-325 MG tablet Take 1-2 tablets by mouth every 6 (six) hours as needed. 12/21/16   Roxy HorsemanBrowning, Kelby Adell, PA-C  ibuprofen (ADVIL,MOTRIN) 800 MG tablet Take 1 tablet (800 mg total) by mouth 3 (three) times daily. 12/21/16   Roxy HorsemanBrowning, Derrion Tritz, PA-C  LISINOPRIL PO Take by mouth.    [provider]    Family History Family History  Problem  Relation Age of Onset  . Cancer Mother   . Hypertension Mother   . Diabetes Father   . Hypertension Father     Social History Social History  Substance Use Topics  . Smoking status: Never Smoker  . Smokeless tobacco: Never Used  . Alcohol use No     Allergies   Penicillins   Review of Systems Review of Systems  All other systems reviewed and are negative.    Physical Exam Updated Vital Signs BP (!) 156/107 (BP Location: Left Arm)   Pulse 92   Temp 98.8 F (37.1 C) (Oral)   Resp 18   Ht 5\' 10"  (1.778 m)   Wt 104.3 kg (230 lb)   LMP 12/07/2016   SpO2 97%   BMI 33.00 kg/m   Physical Exam Nursing note and vitals reviewed.  Constitutional: Pt appears well-developed and well-nourished. No distress.  HENT:  Head: Normocephalic and atraumatic.  Eyes: Conjunctivae are normal.  Neck: Normal range of motion.  Cardiovascular: Normal rate, regular rhythm. Intact distal pulses.   Capillary refill < 3 sec.  Pulmonary/Chest: Effort normal and breath sounds normal.  Musculoskeletal:  RLE Pt exhibits tenderness to palpation over the right calf, but negative Thompson test.   ROM: limited 2/2 pain  Strength:  limited 2/2 pain  Neurological: Pt  is alert. Coordination normal.  Sensation: 5/5 Skin: Skin is warm and dry. Pt is not diaphoretic.  No evidence of open wound or skin tenting Psychiatric: Pt has a normal mood and affect.     ED Treatments / Results  Labs (all labs ordered are listed, but only abnormal results are displayed) Labs Reviewed - No data to display  EKG  EKG Interpretation None       Radiology Dg Ankle Complete Right  Result Date: 12/21/2016 CLINICAL DATA:  Right ankle pain after bending down to pick up 25lb water pack today. EXAM: RIGHT ANKLE - COMPLETE 3+ VIEW COMPARISON:  None. FINDINGS: There is no evidence of fracture, dislocation, or joint effusion. There is no evidence of arthropathy or other focal bone abnormality. Accessory ossicle  adjacent to the anterior process of the calcaneus versus sequela of remote prior injury. Soft tissues are unremarkable. IMPRESSION: No fracture or dislocation of the right ankle. Electronically Signed   By: Rubye Oaks M.D.   On: 12/21/2016 00:24   Dg Knee Complete 4 Views Right  Result Date: 12/21/2016 CLINICAL DATA:  Right knee pain after bending down to pick up 25lb water pack today. EXAM: RIGHT KNEE - COMPLETE 4+ VIEW COMPARISON:  None. FINDINGS: No evidence of fracture, dislocation, or joint effusion. No evidence of arthropathy or other focal bone abnormality. Soft tissues are unremarkable. IMPRESSION: Negative radiographs of the right knee. Electronically Signed   By: Rubye Oaks M.D.   On: 12/21/2016 00:23    Procedures Procedures (including critical care time)  Medications Ordered in ED Medications - No data to display   Initial Impression / Assessment and Plan / ED Course  I have reviewed the triage vital signs and the nursing notes.  Pertinent labs & imaging results that were available during my care of the patient were reviewed by me and considered in my medical decision making (see chart for details).     Patient X-Ray negative for obvious fracture or dislocation.  Pt advised to follow up with orthopedics. Patient given cam walker and crutches while in ED, conservative therapy recommended and discussed. Patient will be discharged home & is agreeable with above plan. Returns precautions discussed. Pt appears safe for discharge.   Final Clinical Impressions(s) / ED Diagnoses   Final diagnoses:  Right leg pain    New Prescriptions New Prescriptions   HYDROCODONE-ACETAMINOPHEN (NORCO/VICODIN) 5-325 MG TABLET    Take 1-2 tablets by mouth every 6 (six) hours as needed.   IBUPROFEN (ADVIL,MOTRIN) 800 MG TABLET    Take 1 tablet (800 mg total) by mouth 3 (three) times daily.     Roxy Horseman, PA-C 12/21/16 0113    Ward, Layla Maw, DO 12/21/16 [phone]

## 2016-12-21 NOTE — Discharge Instructions (Signed)
Please use the boot and crutches as needed.  Do not put weight on your leg unless you can do so without pain.  Please follow-up with the orthopedic doctor.

## 2017-10-06 ENCOUNTER — Encounter (HOSPITAL_COMMUNITY): Payer: Self-pay | Admitting: Emergency Medicine

## 2017-10-06 ENCOUNTER — Observation Stay (HOSPITAL_COMMUNITY)
Admission: EM | Admit: 2017-10-06 | Discharge: 2017-10-06 | Discharge status: Against Medical Advice | Payer: BLUE CROSS/BLUE SHIELD | Attending: Internal Medicine | Admitting: Internal Medicine

## 2017-10-06 ENCOUNTER — Observation Stay (HOSPITAL_COMMUNITY): Payer: BLUE CROSS/BLUE SHIELD

## 2017-10-06 ENCOUNTER — Emergency Department (HOSPITAL_COMMUNITY): Payer: BLUE CROSS/BLUE SHIELD

## 2017-10-06 ENCOUNTER — Ambulatory Visit (HOSPITAL_COMMUNITY): Admission: RE | Admit: 2017-10-06 | Payer: BLUE CROSS/BLUE SHIELD | Source: Ambulatory Visit

## 2017-10-06 ENCOUNTER — Other Ambulatory Visit: Payer: Self-pay

## 2017-10-06 DIAGNOSIS — R531 Weakness: Secondary | ICD-10-CM | POA: Diagnosis not present

## 2017-10-06 DIAGNOSIS — D509 Iron deficiency anemia, unspecified: Secondary | ICD-10-CM | POA: Diagnosis not present

## 2017-10-06 DIAGNOSIS — R200 Anesthesia of skin: Secondary | ICD-10-CM

## 2017-10-06 DIAGNOSIS — Z5321 Procedure and treatment not carried out due to patient leaving prior to being seen by health care provider: Secondary | ICD-10-CM | POA: Insufficient documentation

## 2017-10-06 DIAGNOSIS — I10 Essential (primary) hypertension: Secondary | ICD-10-CM | POA: Diagnosis not present

## 2017-10-06 DIAGNOSIS — I160 Hypertensive urgency: Secondary | ICD-10-CM | POA: Diagnosis not present

## 2017-10-06 DIAGNOSIS — R42 Dizziness and giddiness: Secondary | ICD-10-CM | POA: Insufficient documentation

## 2017-10-06 DIAGNOSIS — E669 Obesity, unspecified: Secondary | ICD-10-CM | POA: Diagnosis not present

## 2017-10-06 DIAGNOSIS — Z8249 Family history of ischemic heart disease and other diseases of the circulatory system: Secondary | ICD-10-CM | POA: Diagnosis not present

## 2017-10-06 DIAGNOSIS — Z880 Allergy status to penicillin: Secondary | ICD-10-CM | POA: Diagnosis not present

## 2017-10-06 DIAGNOSIS — D72829 Elevated white blood cell count, unspecified: Secondary | ICD-10-CM | POA: Insufficient documentation

## 2017-10-06 DIAGNOSIS — R079 Chest pain, unspecified: Secondary | ICD-10-CM | POA: Diagnosis not present

## 2017-10-06 DIAGNOSIS — R0789 Other chest pain: Secondary | ICD-10-CM | POA: Diagnosis not present

## 2017-10-06 DIAGNOSIS — R202 Paresthesia of skin: Secondary | ICD-10-CM

## 2017-10-06 DIAGNOSIS — D6862 Lupus anticoagulant syndrome: Secondary | ICD-10-CM | POA: Insufficient documentation

## 2017-10-06 DIAGNOSIS — Z79899 Other long term (current) drug therapy: Secondary | ICD-10-CM | POA: Insufficient documentation

## 2017-10-06 DIAGNOSIS — Z6833 Body mass index (BMI) 33.0-33.9, adult: Secondary | ICD-10-CM | POA: Diagnosis not present

## 2017-10-06 LAB — I-STAT CHEM 8, ED
BUN: 12 mg/dL (ref 6–20)
CALCIUM ION: 1.08 mmol/L — AB (ref 1.15–1.40)
CHLORIDE: 108 mmol/L (ref 101–111)
Creatinine, Ser: 0.9 mg/dL (ref 0.44–1.00)
Glucose, Bld: 91 mg/dL (ref 65–99)
HCT: 31 % — ABNORMAL LOW (ref 36.0–46.0)
HEMOGLOBIN: 10.5 g/dL — AB (ref 12.0–15.0)
POTASSIUM: 3.7 mmol/L (ref 3.5–5.1)
SODIUM: 142 mmol/L (ref 135–145)
TCO2: 20 mmol/L — ABNORMAL LOW (ref 22–32)

## 2017-10-06 LAB — DIFFERENTIAL
Basophils Absolute: 0 10*3/uL (ref 0.0–0.1)
Basophils Relative: 0 %
EOS ABS: 0.3 10*3/uL (ref 0.0–0.7)
EOS PCT: 3 %
LYMPHS ABS: 3.1 10*3/uL (ref 0.7–4.0)
LYMPHS PCT: 25 %
Monocytes Absolute: 0.8 10*3/uL (ref 0.1–1.0)
Monocytes Relative: 6 %
NEUTROS PCT: 66 %
Neutro Abs: 8.3 10*3/uL — ABNORMAL HIGH (ref 1.7–7.7)

## 2017-10-06 LAB — COMPREHENSIVE METABOLIC PANEL
ALK PHOS: 65 U/L (ref 38–126)
ALT: 20 U/L (ref 14–54)
ANION GAP: 9 (ref 5–15)
AST: 18 U/L (ref 15–41)
Albumin: 3.5 g/dL (ref 3.5–5.0)
BILIRUBIN TOTAL: 0.4 mg/dL (ref 0.3–1.2)
BUN: 14 mg/dL (ref 6–20)
CALCIUM: 8.5 mg/dL — AB (ref 8.9–10.3)
CO2: 21 mmol/L — ABNORMAL LOW (ref 22–32)
Chloride: 110 mmol/L (ref 101–111)
Creatinine, Ser: 0.91 mg/dL (ref 0.44–1.00)
GFR calc Af Amer: >60 mL/min (ref >60)
Glucose, Bld: 90 mg/dL (ref 65–99)
POTASSIUM: 3.8 mmol/L (ref 3.5–5.1)
Sodium: 140 mmol/L (ref 135–145)
TOTAL PROTEIN: 7.5 g/dL (ref 6.5–8.1)

## 2017-10-06 LAB — PROTIME-INR
INR: 0.98
PROTHROMBIN TIME: 12.9 s (ref 11.4–15.2)

## 2017-10-06 LAB — I-STAT TROPONIN, ED: Troponin i, poc: 0 ng/mL (ref 0.00–0.08)

## 2017-10-06 LAB — CBG MONITORING, ED: GLUCOSE-CAPILLARY: 82 mg/dL (ref 65–99)

## 2017-10-06 LAB — CBC
HCT: 32.5 % — ABNORMAL LOW (ref 36.0–46.0)
HEMOGLOBIN: 9.2 g/dL — AB (ref 12.0–15.0)
MCH: 17.3 pg — AB (ref 26.0–34.0)
MCHC: 28.3 g/dL — AB (ref 30.0–36.0)
MCV: 61.2 fL — AB (ref 78.0–100.0)
Platelets: 355 10*3/uL (ref 150–400)
RBC: 5.31 MIL/uL — AB (ref 3.87–5.11)
RDW: 19.4 % — ABNORMAL HIGH (ref 11.5–15.5)
WBC: 12.6 10*3/uL — ABNORMAL HIGH (ref 4.0–10.5)

## 2017-10-06 LAB — I-STAT BETA HCG BLOOD, ED (MC, WL, AP ONLY): I-stat hCG, quantitative: <5 m[IU]/mL (ref <5)

## 2017-10-06 LAB — TROPONIN I: Troponin I: <0.03 ng/mL (ref <0.03)

## 2017-10-06 LAB — TSH: TSH: 1.451 u[IU]/mL (ref 0.350–4.500)

## 2017-10-06 LAB — SEDIMENTATION RATE: Sed Rate: 12 mm/hr (ref 0–22)

## 2017-10-06 LAB — APTT: aPTT: 32 seconds (ref 24–36)

## 2017-10-06 MED ORDER — SODIUM CHLORIDE 0.9 % IV SOLN: 1.0000 g | Freq: Once | INTRAVENOUS | Status: DC

## 2017-10-06 MED ORDER — IOPAMIDOL (ISOVUE-370) INJECTION 76%
100.0000 mL | Freq: Once | INTRAVENOUS | Status: AC | PRN
  Administered 2017-10-06: 80 mL via INTRAVENOUS

## 2017-10-06 MED ORDER — SENNOSIDES-DOCUSATE SODIUM 8.6-50 MG PO TABS: 1.0000 | ORAL_TABLET | Freq: Every evening | ORAL | Status: DC | PRN

## 2017-10-06 MED ORDER — ACETAMINOPHEN 325 MG PO TABS: 650.0000 mg | ORAL_TABLET | ORAL | Status: DC | PRN

## 2017-10-06 MED ORDER — ENOXAPARIN SODIUM 40 MG/0.4ML ~~LOC~~ SOLN: 40.0000 mg | SUBCUTANEOUS | Status: DC

## 2017-10-06 MED ORDER — ASPIRIN 325 MG PO TABS: 325.0000 mg | ORAL_TABLET | Freq: Once | ORAL | Status: DC

## 2017-10-06 MED ORDER — SODIUM CHLORIDE 0.9 % IV SOLN: INTRAVENOUS | Status: DC

## 2017-10-06 MED ORDER — STROKE: EARLY STAGES OF RECOVERY BOOK
Freq: Once | Status: DC
  Filled 2017-10-06: qty 1

## 2017-10-06 MED ORDER — ACETAMINOPHEN 160 MG/5ML PO SOLN: 650.0000 mg | ORAL | Status: DC | PRN

## 2017-10-06 MED ORDER — ACETAMINOPHEN 650 MG RE SUPP: 650.0000 mg | RECTAL | Status: DC | PRN

## 2017-10-06 MED ORDER — HYDRALAZINE HCL 20 MG/ML IJ SOLN: 10.0000 mg | INTRAMUSCULAR | Status: DC | PRN

## 2017-10-06 MED ORDER — IOPAMIDOL (ISOVUE-370) INJECTION 76%
INTRAVENOUS | Status: AC
  Filled 2017-10-06: qty 100

## 2017-10-06 NOTE — ED Notes (Signed)
Unable to collect labs MD is in the room

## 2017-10-06 NOTE — ED Provider Notes (Signed)
[facility]-EMERGENCY DEPT Provider Note   CSN: [id number] Arrival date & time: 10/06/17  1514     History   Chief Complaint Chief Complaint  Patient presents with  . Chest Pain    HPI Kristen Tate is a 37 y.o. female.  HPI   She presents for evaluation of chest pain with a numb feeling in the entire left side of her body.  The symptoms started about 1 hour prior to arrival, spontaneously.  No prior similar problems.  She came here by private vehicle for evaluation.  She also complains of a mild headache.  She denies blurred vision, weakness or dizziness.  She does not take medications regularly.  There is been no vomiting, cough, shortness of breath, injuries.  There are no other known modifying factors.  Past Medical History:  Diagnosis Date  . Anemia   . Hypertension   . Lupus anticoagulant disorder (HCC)     There are no active problems to display for this patient.   Past Surgical History:  Procedure Laterality Date  . BREAST REDUCTION SURGERY    . CHOLECYSTECTOMY    . scar tissue removed        OB History   None      Home Medications    Prior to Admission medications   Medication Sig Start Date End Date Taking? Authorizing Provider  aspirin-acetaminophen-caffeine (EXCEDRIN MIGRAINE) [phone] MG tablet Take 2 tablets by mouth every 6 (six) hours as needed for headache.   Yes [provider]  ibuprofen (ADVIL,MOTRIN) 200 MG tablet Take 600 mg by mouth every 6 (six) hours as needed for moderate pain.   Yes [provider]  diphenhydrAMINE (BENADRYL) 25 MG tablet Take 25 mg by mouth at bedtime as needed for itching. 09/21/13   Toy Cookey, MD  doxycycline (VIBRA-TABS) 100 MG tablet Take 1 tablet (100 mg total) by mouth 2 (two) times daily. Patient not taking: Reported on 10/06/2017 10/19/13   Earley Favor, NP  ferrous sulfate 325 (65 FE) MG tablet Take 1 tablet (325 mg total) by mouth daily. Patient not taking:  Reported on 10/06/2017 10/06/14   Oswaldo Conroy, PA-C  HYDROcodone-acetaminophen (NORCO/VICODIN) 5-325 MG tablet Take 1-2 tablets by mouth every 6 (six) hours as needed. Patient not taking: Reported on 10/06/2017 12/21/16   Roxy Horseman, PA-C  ibuprofen (ADVIL,MOTRIN) 800 MG tablet Take 1 tablet (800 mg total) by mouth 3 (three) times daily. Patient not taking: Reported on 10/06/2017 12/21/16   Roxy Horseman, PA-C    Family History Family History  Problem Relation Age of Onset  . Cancer Mother   . Hypertension Mother   . Diabetes Father   . Hypertension Father     Social History Social History   Tobacco Use  . Smoking status: Never Smoker  . Smokeless tobacco: Never Used  Substance Use Topics  . Alcohol use: No  . Drug use: No     Allergies   Penicillins   Review of Systems Review of Systems  All other systems reviewed and are negative.    Physical Exam Updated Vital Signs BP (!) 168/98   Pulse 86   Temp 97.8 F (36.6 C)   Resp (!) 23   Ht  (1.778 m)   Wt 106.6 kg (235 lb)   LMP 09/09/2017 (Approximate)   SpO2 100%   BMI 33.72 kg/m   Physical Exam  Constitutional: She is oriented to person, place, and time. She appears well-developed.  Overweight  HENT:  Head: Normocephalic and atraumatic.  Eyes: Pupils are equal, round, and reactive to light. Conjunctivae and EOM are normal.  Neck: Normal range of motion and phonation normal. Neck supple.  Cardiovascular: Normal rate and regular rhythm.  Pulmonary/Chest: Effort normal and breath sounds normal. She exhibits no tenderness.  Abdominal: Soft. She exhibits no distension. There is no tenderness. There is no guarding.  Musculoskeletal: Normal range of motion.  Neurological: She is alert and oriented to person, place, and time. She exhibits normal muscle tone.  No dysarthria, aphasia or nystagmus.  Decreased light touch sensation face, left arm, left hand, and left leg.  Mild left facial weakness and  decreased left grip strength.  When tongue protrudes he does not protrude in the midline but drifts to the right.  No ataxia or pronator drift.  Skin: Skin is warm and dry.  Psychiatric: Her behavior is normal. Judgment and thought content normal.  She is anxious  Nursing note and vitals reviewed.    ED Treatments / Results  Labs (all labs ordered are listed, but only abnormal results are displayed) Labs Reviewed  I-STAT CHEM 8, ED - Abnormal; Notable for the following components:      Result Value   Calcium, Ion 1.08 (*)    TCO2 20 (*)    Hemoglobin 10.5 (*)    HCT 31.0 (*)    All other components within normal limits  PROTIME-INR  APTT  CBC  DIFFERENTIAL  COMPREHENSIVE METABOLIC PANEL  I-STAT TROPONIN, ED  CBG MONITORING, ED  I-STAT BETA HCG BLOOD, ED ([facility], [facility], [facility] ONLY)    EKG EKG Interpretation  Date/Time:  Wednesday Oct 06 2017 15:20:28 EDT Ventricular Rate:  85 PR Interval:    QRS Duration: 92 QT Interval:  385 QTC Calculation: 458 R Axis:   31 Text Interpretation:  Sinus rhythm since last tracing no significant change Confirmed by Mancel Bale [phone]) on 10/06/2017 3:30:42 PM Also confirmed by Mancel Bale [phone]), editor Sheppard Evens [phone])  on 10/06/2017 3:47:33 PM   Radiology Ct Head Code Stroke Wo Contrast  Result Date: 10/06/2017 CLINICAL DATA:  Code stroke. 37 year old female with left side weakness, numbness and facial asymmetry. EXAM: CT HEAD WITHOUT CONTRAST TECHNIQUE: Contiguous axial images were obtained from the base of the skull through the vertex without intravenous contrast. COMPARISON:  None. FINDINGS: Brain: Partially empty sella. Cerebral volume appears within normal limits. No midline shift, ventriculomegaly, mass effect, evidence of mass lesion, intracranial hemorrhage or evidence of cortically based acute infarction. Gray-white matter differentiation is within normal limits throughout the brain. No cortical encephalomalacia. Vascular: No  suspicious intracranial vascular hyperdensity. Skull: Negative. Sinuses/Orbits: Bilateral mastoid effusions and/or sclerosis. The tympanic cavities remain clear. The visible bilateral paranasal sinuses are clear. Other: Questionable Disconjugate gaze. Otherwise negative orbits soft tissues. No acute scalp soft tissue findings. ASPECTS [facility] Stroke Program Early CT Score) - Ganglionic level infarction (caudate, lentiform nuclei, internal capsule, insula, M1-M3 cortex): 7 - Supraganglionic infarction (M4-M6 cortex): 3 Total score (0-10 with 10 being normal): 10 IMPRESSION: 1. Negative noncontrast CT appearance of the brain. No intracranial hemorrhage or cortically based infarct identified. 2. ASPECTS is 10. 3. Study discussed by telephone with Dr. Mancel Bale on 10/06/2017 at 15:39 . Electronically Signed   By: Odessa Fleming M.D.   On: 10/06/2017 15:39    Procedures .Critical Care Performed by: Mancel Bale, MD Authorized by: Mancel Bale, MD   Critical care provider statement:    Critical care time (minutes):  40  Critical care start time:  10/06/2017 3:31 PM   Critical care end time:  10/06/2017 4:21 PM   Critical care time was exclusive of:  Separately billable procedures and treating other patients   Critical care was time spent personally by me on the following activities:  Blood draw for specimens, development of treatment plan with patient or surrogate, discussions with consultants, evaluation of patient's response to treatment, examination of patient, obtaining history from patient or surrogate, ordering and performing treatments and interventions, ordering and review of laboratory studies, pulse oximetry, re-evaluation of patient's condition, review of old charts and ordering and review of radiographic studies   (including critical care time)  Medications Ordered in ED Medications - No data to display   Initial Impression / Assessment and Plan / ED Course  I have reviewed the triage vital  signs and the nursing notes.  Pertinent labs & imaging results that were available during my care of the patient were reviewed by me and considered in my medical decision making (see chart for details).  Clinical Course as of Oct 06 1624  Wed Oct 06, 2017  1620 I discussed the case with the tele-neurologist, who states that the patient's symptoms started around 11 AM this morning.  He feels like she may have conversion disorder.  He has ordered a CTA head and neck, and advises admission for work-up and observation.   [EW]  1625 Normal  I-Stat beta hCG blood, ED [EW]  1625 Normal except calcium low, CO2 low, hemoglobin low  I-Stat Chem 8, ED(!) [EW]  1625 Normal  I-stat troponin, ED [EW]    Clinical Course User Index [EW] Mancel Bale, MD     Patient Vitals for the past 24 hrs:  BP Temp Pulse Resp SpO2 Height Weight  10/06/17 1624 - - - - -  (1.778 m) -  10/06/17 1616 (!) 168/98 - 86 (!) 23 100 % - -  10/06/17 1604 - 97.8 F (36.6 C) - - - - -  10/06/17 1600 (!) 168/98 - 82 16 100 % - -  10/06/17 1550 - - - - - - 106.6 kg (235 lb)   4:26 PM Reevaluation with update and discussion. After initial assessment and treatment, an updated evaluation reveals she states she feels somewhat better her chest pain is now improved at 8/10.  She was updated on the findings and plan. Mancel Bale   Medical Decision Making: Patient presenting for evaluation of chest pain with paresthesia.  Short-term symptoms, within an hour, therefore the patient was made a code stroke at triage by nursing.  EKG does not indicate ischemia or infarct.  Blood pressure elevated initially leading to possibility of hypertensive urgency, versus intracranial bleeding.  CRITICAL CARE-yes Performed by: Mancel Bale   Nursing Notes Reviewed/ Care Coordinated Applicable Imaging Reviewed Interpretation of Laboratory Data incorporated into ED treatment     Final Clinical Impressions(s) / ED Diagnoses   Final  diagnoses:  Hypertension, unspecified type  Paresthesia  Nonspecific chest pain    ED Discharge Orders    None       Mancel Bale, MD 10/06/17 1626

## 2017-10-06 NOTE — ED Notes (Signed)
Patient transported to CT 

## 2017-10-06 NOTE — ED Notes (Signed)
Effie Shy MD at bedside. Pt positive for left sided weakness/numbness and left facial asymmetrical. Pt verbalizes symptoms associated with onset of left chest tightness 2 hours ago.

## 2017-10-06 NOTE — Consult Note (Signed)
   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 Symptoms  are consistent with LVO therefore CTA Head and Neck recommended.  Comments:   Last Known Well: 11:00 [facility] contacted: 16:01 [facility] at bedside: 16:06 NIHSS assessment time: 16:07  Recommendations:  Admit for stroke workup. ASA/Statin if no contraindications. IV Fluids Inpatient neurology consultation Inpatient stroke evaluation as per Neurology/ Internal Medicine Discussed with ED MD Please call with questions  -----------------------------------------------------------------------------------------  CC: right sided numbness and chest pain  History of Present Illness :  37 year old female with past medical history significant for hypertension and lupus anticoagulant who presented with chest tightness and left-sided numbness and weakness with left facial droop and mild dysarthria.  Patient states that her symptoms started suddenly at around 11:00 and has been persistent since then.  Denies any prior history of stroke or blood clots.    Diagnostic: CT Head: No acute Intracranial findings.  Exam: NIH Stroke Scale/Score (NIHSS)   RESULT SUMMARY: 6 points NIH Stroke Scale   INPUTS: 1A: Level of consciousness -> 0 = Alert; keenly responsive 1B: Ask month and age -> 0 = Both questions right 1C: 'Blink eyes' & 'squeeze hands' -> 0 = Performs both tasks 2: Horizontal extraocular movements -> 0 = Normal 3: Visual fields -> 0 = No visual loss 4: Facial palsy -> 1 = Minor paralysis (flat nasolabial fold, smile asymmetry) 5A: Left arm motor drift -> 1 = Drift, but doesn't hit bed 5B: Right arm motor drift -> 0 = No drift for 10 seconds 6A: Left leg motor drift -> 1 = Drift, but doesn't hit bed 6B: Right leg motor drift -> 0 = No drift for 5 seconds 7: Limb Ataxia -> 0 = No ataxia 8: Sensation  -> 2 = Complete loss: cannot sense being touched at all 9: Language/aphasia -> 0 = Normal; no aphasia 10: Dysarthria -> 1 = Mild-moderate dysarthria: slurring but can be understood 11: Extinction/inattention -> 0 = No abnormality   Medical Decision Making:  - Extensive number of diagnosis or management options are considered above.   - Extensive amount of complex data reviewed.   - High risk of complication and/or morbidity or mortality are associated with differential diagnostic considerations above.  - There may be Uncertain outcome and increased probability of prolonged functional impairment or high probability of severe prolonged functional impairment associated with some of these differential diagnosis.  Medical Data Reviewed:  1.Data reviewed include clinical labs, radiology,  Medical Tests;   2.Tests results discussed w/performing or interpreting physician;   3.Obtaining/reviewing old medical records;  4.Obtaining case history from another source;  5.Independent review of image, tracing or specimen.    Patient was informed the Neurology Consult would happen via telehealth (remote video) and consented to receiving care in this manner.

## 2017-10-06 NOTE — ED Provider Notes (Signed)
3:52 PM Care assumed from Dr. Effie Shy. At time of transfer care, patient is awaiting assessment by the tele-neurology team.  Patient was made a code stroke on arrival for left-sided numbness and weakness with onset of aopproximately 1 hour prior to arrival.  4:49 PM Dr. Effie Shy reports that the telemetry neurology requested CTA of the head and neck.  CTA reassuring.  Neurology note was reviewed and they request she be admitted to hospitalist service and have neurology see the patient.  They found her to have an NIH scale of 6.    Neurology team will be called and hospitalist and will be called for admission to [facility].   9:05 PM Patient reports she is starting to feel better and does not want to be admitted.  Patient was advised that this is going AGAINST MEDICAL ADVICE of the specialist that we consulted.  Patient understands the risks of symptoms worsening or death.  Patient will leave AGAINST MEDICAL ADVICE to continue outpatient work-up and management of her left-sided numbness, weakness, and chest pain.   Clinical Impression: 1. Hypertension, unspecified type   2. Paresthesia   3. Nonspecific chest pain   4. Left-sided weakness     Disposition: Patient leaving AGAINST MEDICAL ADVICE       Tegeler, Canary Brim, MD 10/06/17 2311

## 2017-10-06 NOTE — H&P (Addendum)
History and Physical    Kristen Tate NOB:[id number] DOB: 1981/05/29 DOA: 10/06/2017  Referring MD/NP/PA: Dr. Marda Stalker PCP: Patient, No Pcp Per  Patient coming from: work  Chief Complaint: Chest tightness  I have personally briefly reviewed patient's old medical records in [place]  Patient Left AMA after initial admission orders placed: The patient was extensively counseled that she did have a very serious condition which required medical attention. She was informed that the consequence of leaving AMA could be  death or serious injury.  She did acknowledge these risks and showed understanding.  She did express coherence and ability to make her own decisions. However, she did elect to leave the hospital AMA.       HPI: Kristen Tate is a 37 y.o. right-hand-dominant female with medical history significant of HTN, anemia, and lupus anticoagulant; who presents with complaints of chest tightness patient provides history.  symptoms started sometime around 11 AM while the patient was at work with complaints of acute onset of upper left chest tightness sensation that she describes as feeling like a ton of bricks sitting on her chest.  She felt short of breath, dizziness, and broke out into a cold sweat.  Approximately 45 minutes later she reports feeling a shooting pain running down her left side and she reports feeling numbness.  Denies weakness, palpitations, nausea, vomiting, diarrhea, recent illness, or cough.  Prior to the onset of symptoms she reports having a bitemporal headache and thought she may be getting a migraine.  Normally patient reports getting migraine headaches approximately once a month and takes Excedrin to treat symptoms.  Denies any tobacco or illicit drug use.  She drinks alcohol occasionally, but not on a daily basis.  Patient reports not having a primary care provider due to loss of insurance and currently is not on any prescribed medications.  Previously,  reports taking lisinopril for blood pressure.  She has known lupus anticoagulant disorder that was found incidentally on screening labs prior to breast surgery reduction over 10 years ago.  Patient reports previously being on Femara 3 months and clomiphene 1 month while trying to get pregnant.  These medications were stopped approximately 1 month ago.  Patient makes note that her mom had a rare spinal cord cancer that she calls "purple dragon cancer" or leiomyosarcoma.   ED Course: Upon admission into the emergency department patient was noted to be afebrile, pulse 80-90, respirations 16-25, blood pressure 106/74-168/98, and O2 saturation maintained on room air.  Code stroke was initiated given patient's symptoms, but TPA was not recommended. Tele neurology was consulted given patient's symptoms and recommended CT angiogram of the head and neck which did not show any acute abnormalities.  Labs revealed WBC 12.6, hemoglobin 9.2, MCV 61.2, MCH 17.3, and all other labs relatively within normal limits.  Dr. Cheral Marker of neurology at [facility] recommended admission for completion of stroke work-up at [facility].  [facility] called to admit.  Review of Systems  Constitutional: Positive for chills and diaphoresis. Negative for fever.  HENT: Negative for ear pain and tinnitus.   Eyes: Negative for pain and discharge.  Respiratory: Positive for shortness of breath (now resolved). Negative for cough.   Cardiovascular: Positive for chest pain. Negative for leg swelling.  Gastrointestinal: Negative for abdominal pain, nausea and vomiting.  Genitourinary: Negative for dysuria and frequency.  Musculoskeletal: Negative for falls and joint pain.  Skin: Negative for itching and rash.  Neurological: Positive for dizziness, sensory change and headaches. Negative  for loss of consciousness and weakness.  Psychiatric/Behavioral: Negative for substance abuse and suicidal ideas.    Past Medical History:  Diagnosis Date  .  Anemia   . Hypertension   . Lupus anticoagulant disorder [facility])     Past Surgical History:  Procedure Laterality Date  . BREAST REDUCTION SURGERY    . CHOLECYSTECTOMY    . scar tissue removed        reports that she has never smoked. She has never used smokeless tobacco. She reports that she does not drink alcohol or use drugs.  Allergies  Allergen Reactions  . Penicillins     Childhood Has patient had a PCN reaction causing immediate rash, facial/tongue/throat swelling, SOB or lightheadedness with hypotension: Yes Has patient had a PCN reaction causing severe rash involving mucus membranes or skin necrosis: Yes Has patient had a PCN reaction that required hospitalization: No Has patient had a PCN reaction occurring within the last 10 years: No If all of the above answers are "NO", then may proceed with Cephalosporin use.    Family History  Problem Relation Age of Onset  . Cancer Mother        leiomyosarcoma  . Hypertension Mother   . Diabetes Father   . Hypertension Father   . CVA Father 62     Prior to Admission medications   Medication Sig Start Date End Date Taking? Authorizing Provider  aspirin-acetaminophen-caffeine (EXCEDRIN MIGRAINE) [id number] MG tablet Take 2 tablets by mouth every 6 (six) hours as needed for headache.   Yes [provider]  ibuprofen (ADVIL,MOTRIN) 200 MG tablet Take 600 mg by mouth every 6 (six) hours as needed for moderate pain.   Yes [provider]  diphenhydrAMINE (BENADRYL) 25 MG tablet Take 25 mg by mouth at bedtime as needed for itching. 09/21/13   Ernestina Patches, MD  doxycycline (VIBRA-TABS) 100 MG tablet Take 1 tablet (100 mg total) by mouth 2 (two) times daily. Patient not taking: Reported on 10/06/2017 10/19/13   Junius Creamer, NP  ferrous sulfate 325 (65 FE) MG tablet Take 1 tablet (325 mg total) by mouth daily. Patient not taking: Reported on 10/06/2017 10/06/14   Al Corpus, PA-C  HYDROcodone-acetaminophen  (NORCO/VICODIN) 5-325 MG tablet Take 1-2 tablets by mouth every 6 (six) hours as needed. Patient not taking: Reported on 10/06/2017 12/21/16   Montine Circle, PA-C  ibuprofen (ADVIL,MOTRIN) 800 MG tablet Take 1 tablet (800 mg total) by mouth 3 (three) times daily. Patient not taking: Reported on 10/06/2017 12/21/16   Montine Circle, PA-C    Physical Exam:  Constitutional: Obese female NAD, calm, comfortable Vitals:   10/06/17 1744 10/06/17 1817 10/06/17 1830 10/06/17 1900  BP: 106/74 (!) 139/108 (!) 151/93 (!) 154/98  Pulse: 80 86 90 85  Resp: '18 18  18  '$ Temp:      SpO2: 100% 100% 100% 100%  Weight:      Height:       Eyes: PERRL, lids and conjunctivae normal ENMT: Mucous membranes are moist. Posterior pharynx clear of any exudate or lesions.Normal dentition.  Neck: normal, supple, no masses, no thyromegaly Respiratory: clear to auscultation bilaterally, no wheezing, no crackles. Normal respiratory effort. No accessory muscle use.  Cardiovascular: Regular rate and rhythm, no murmurs / rubs / gallops. No extremity edema. 2+ pedal pulses. No carotid bruits.  Abdomen: no tenderness, no masses palpated. No hepatosplenomegaly. Bowel sounds positive.  Musculoskeletal: no clubbing / cyanosis. No joint deformity upper and lower extremities. Good ROM, no contractures.  Normal muscle tone.  Skin: no rashes, lesions, ulcers. No induration Neurologic: CN 2-12 grossly intact. Sensation intact, DTR normal. Strength 5/5 in all 4.  Psychiatric: Normal judgment and insight. Alert and oriented x 3. Normal mood.     Labs on Admission: I have personally reviewed following labs and imaging studies  CBC: Recent Labs  Lab 10/06/17 1558 10/06/17 1604  WBC 12.6*  --   NEUTROABS 8.3*  --   HGB 9.2* 10.5*  HCT 32.5* 31.0*  MCV 61.2*  --   PLT 355  --    Basic Metabolic Panel: Recent Labs  Lab 10/06/17 1558 10/06/17 1604  NA 140 142  K 3.8 3.7  CL 110 108  CO2 21*  --   GLUCOSE 90 91  BUN 14  12  CREATININE 0.91 0.90  CALCIUM 8.5*  --    GFR: Estimated Creatinine Clearance: 113.1 mL/min (by C-G formula based on SCr of 0.9 mg/dL). Liver Function Tests: Recent Labs  Lab 10/06/17 1558  AST 18  ALT 20  ALKPHOS 65  BILITOT 0.4  PROT 7.5  ALBUMIN 3.5   No results for input(s): LIPASE, AMYLASE in the last 168 hours. No results for input(s): AMMONIA in the last 168 hours. Coagulation Profile: Recent Labs  Lab 10/06/17 1558  INR 0.98   Cardiac Enzymes: No results for input(s): CKTOTAL, CKMB, CKMBINDEX, TROPONINI in the last 168 hours. BNP (last 3 results) No results for input(s): PROBNP in the last 8760 hours. HbA1C: No results for input(s): HGBA1C in the last 72 hours. CBG: Recent Labs  Lab 10/06/17 1603  GLUCAP 82   Lipid Profile: No results for input(s): CHOL, HDL, LDLCALC, TRIG, CHOLHDL, LDLDIRECT in the last 72 hours. Thyroid Function Tests: No results for input(s): TSH, T4TOTAL, FREET4, T3FREE, THYROIDAB in the last 72 hours. Anemia Panel: No results for input(s): VITAMINB12, FOLATE, FERRITIN, TIBC, IRON, RETICCTPCT in the last 72 hours. Urine analysis: No results found for: COLORURINE, APPEARANCEUR, LABSPEC, PHURINE, GLUCOSEU, HGBUR, BILIRUBINUR, KETONESUR, PROTEINUR, UROBILINOGEN, NITRITE, LEUKOCYTESUR Sepsis Labs: No results found for this or any previous visit (from the past 240 hour(s)).   Radiological Exams on Admission: Ct Angio Head W Or Wo Contrast  Result Date: 10/06/2017 CLINICAL DATA:  Dizziness and left-sided numbness beginning 6 hours ago EXAM: CT ANGIOGRAPHY HEAD AND NECK TECHNIQUE: Multidetector CT imaging of the head and neck was performed using the standard protocol during bolus administration of intravenous contrast. Multiplanar CT image reconstructions and MIPs were obtained to evaluate the vascular anatomy. Carotid stenosis measurements (when applicable) are obtained utilizing NASCET criteria, using the distal internal carotid diameter  as the denominator. CONTRAST:  61m ISOVUE-370 IOPAMIDOL (ISOVUE-370) INJECTION 76% COMPARISON:  Head CT same day FINDINGS: CTA NECK FINDINGS Aortic arch: Normal Right carotid system: Common carotid artery is normal. Carotid bifurcation is normal. Cervical ICA is normal. Left carotid system: Common carotid artery is normal. Carotid bifurcation is normal. Cervical ICA is normal. Vertebral arteries: Both vertebral artery origins are widely patent. Both vertebral arteries are approximately equal in size an widely patent through the cervical region. Skeleton: Normal Other neck: No neck lesion. Upper chest: Clear Review of the MIP images confirms the above findings CTA HEAD FINDINGS Anterior circulation: Both internal carotid arteries are widely patent through the skull base and siphon regions. The anterior and middle cerebral vessels are patent without proximal stenosis, aneurysm or vascular malformation. Posterior circulation: Both vertebral arteries are patent to the basilar. No basilar stenosis. Posterior circulation branch vessels are normal.  Venous sinuses: Patent and normal. Anatomic variants: None significant Delayed phase: No abnormal enhancement Review of the MIP images confirms the above findings IMPRESSION: Normal CT angiography examinations of the neck and head. No abnormality seen to explain the presenting symptoms. Electronically Signed   By: Nelson Chimes M.D.   On: 10/06/2017 17:33   Ct Angio Neck W Or Wo Contrast  Result Date: 10/06/2017 CLINICAL DATA:  Dizziness and left-sided numbness beginning 6 hours ago EXAM: CT ANGIOGRAPHY HEAD AND NECK TECHNIQUE: Multidetector CT imaging of the head and neck was performed using the standard protocol during bolus administration of intravenous contrast. Multiplanar CT image reconstructions and MIPs were obtained to evaluate the vascular anatomy. Carotid stenosis measurements (when applicable) are obtained utilizing NASCET criteria, using the distal internal  carotid diameter as the denominator. CONTRAST:  76m ISOVUE-370 IOPAMIDOL (ISOVUE-370) INJECTION 76% COMPARISON:  Head CT same day FINDINGS: CTA NECK FINDINGS Aortic arch: Normal Right carotid system: Common carotid artery is normal. Carotid bifurcation is normal. Cervical ICA is normal. Left carotid system: Common carotid artery is normal. Carotid bifurcation is normal. Cervical ICA is normal. Vertebral arteries: Both vertebral artery origins are widely patent. Both vertebral arteries are approximately equal in size an widely patent through the cervical region. Skeleton: Normal Other neck: No neck lesion. Upper chest: Clear Review of the MIP images confirms the above findings CTA HEAD FINDINGS Anterior circulation: Both internal carotid arteries are widely patent through the skull base and siphon regions. The anterior and middle cerebral vessels are patent without proximal stenosis, aneurysm or vascular malformation. Posterior circulation: Both vertebral arteries are patent to the basilar. No basilar stenosis. Posterior circulation branch vessels are normal. Venous sinuses: Patent and normal. Anatomic variants: None significant Delayed phase: No abnormal enhancement Review of the MIP images confirms the above findings IMPRESSION: Normal CT angiography examinations of the neck and head. No abnormality seen to explain the presenting symptoms. Electronically Signed   By: MNelson ChimesM.D.   On: 10/06/2017 17:33   Ct Head Code Stroke Wo Contrast  Result Date: 10/06/2017 CLINICAL DATA:  Code stroke. 37year old female with left side weakness, numbness and facial asymmetry. EXAM: CT HEAD WITHOUT CONTRAST TECHNIQUE: Contiguous axial images were obtained from the base of the skull through the vertex without intravenous contrast. COMPARISON:  None. FINDINGS: Brain: Partially empty sella. Cerebral volume appears within normal limits. No midline shift, ventriculomegaly, mass effect, evidence of mass lesion, intracranial  hemorrhage or evidence of cortically based acute infarction. Gray-white matter differentiation is within normal limits throughout the brain. No cortical encephalomalacia. Vascular: No suspicious intracranial vascular hyperdensity. Skull: Negative. Sinuses/Orbits: Bilateral mastoid effusions and/or sclerosis. The tympanic cavities remain clear. The visible bilateral paranasal sinuses are clear. Other: Questionable Disconjugate gaze. Otherwise negative orbits soft tissues. No acute scalp soft tissue findings. ASPECTS ([facility]) - Ganglionic level infarction (caudate, lentiform nuclei, internal capsule, insula, M1-M3 cortex): 7 - Supraganglionic infarction (M4-M6 cortex): 3 Total score (0-10 with 10 being normal): 10 IMPRESSION: 1. Negative noncontrast CT appearance of the brain. No intracranial hemorrhage or cortically based infarct identified. 2. ASPECTS is 10. 3. Study discussed by telephone with Dr. EDaleen Boon 10/06/2017 at 15:39 . Electronically Signed   By: HGenevie AnnM.D.   On: 10/06/2017 15:39    EKG: Independently reviewed.  Sinus rhythm at 85 bpm with signs of possible left atrial abnormality  Assessment/Plan Left-sided numbness: Acute.  Patient complains of left-sided numbness from the face down.  Patient  was called as a code stroke with CT angiogram of the head and neck negative for any acute abnormalities.  TPA was not recommended.  Neurology consulted and recommended transfer to [facility].  Differential includes possible stroke vs. paresthesias related to electrolyte abnormality vs. complex migraine vs. other cause. - Admit to telemetry [facility] at [facility] - Stroke order set initiated - Check ESR, TSH, magnesium - Neuro checks - Check  MRI/MRA head w/o contrast   - Check echocardiogram and Vas carotid U/S  - Check Hemoglobin A1c and lipid panel in a.m - ASA 325 mg x 1 dose now - PT/OT/Speech to eval and treat - Appreciate neurology consultative services, will  follow-up   Chest pain: Acute.  Patient reported symptoms started with left-sided chest tightness.  Initial troponin negative with EKG showing no clear signs of ischemia.  Patient is at high risk for clots and thromboembolism given recent medication usage and lupus anticoagulant history. - Check UDS - Trend cardiac enzymes - Follow-up echocardiogram  Hypertensive urgency: Acute.  Initial blood pressures noted to be elevated up to 176/125.  Patient previously on lisinopril. - Initially allowing for permissive hypertension   - Hydralazine prn  - May consider restarting lisinopril  Leukocytosis: WBC elevated at 12.6.  Question cause of symptoms. - Recheck CBC in a.m.  Microcytic hypochromic anemia: Patient presents with a hemoglobin of 9.2 on admission with low MCV and MCH suggesting iron deficiency anemia.  Patient reports previously being on iron infusions due to lack of being unable to tolerate oral iron supplements. - Check TIBC and iron in a.m. - May benefit from iron infusion  Lupus anticoagulant disorder: Incidentally found during presurgical screening over 10 years ago.  Hypocalcemia: Initial calcium level noted to be 8.5 on admission. - Give 1 g of calcium gluconate - Continue to monitor and replace as needed  Obesity: BMI noted to be 33.72.  DVT prophylaxis: lovneonox   Code Status:Full Family Communication: Discussed plan of care with patient and significant other at bedside Disposition Plan: To be determined Consults called: Neurology Admission status: Patient  Norval Morton MD [facility] Pager [phone]   If 7PM-7AM, please contact night-coverage [email or web address] Password [email or web address]  10/06/2017, 7:27 PM

## 2017-10-06 NOTE — Discharge Instructions (Signed)
You are leaving AGAINST MEDICAL ADVICE, please follow-up with your primary care physician for further evaluation and management.  If any symptoms change or worsen, please do not hesitate to return to the ED.

## 2017-10-06 NOTE — ED Notes (Signed)
Pt reports at work today and around 11aM starting having left sided chest pains that radiated to left arm with SOB, dizziness, sweating and left sided numbness. Pt states that she refused for her co workers to call EMS, so she waited an hour for her significant other to get her from work to bring to ED.

## 2017-10-08 ENCOUNTER — Encounter (HOSPITAL_COMMUNITY): Payer: Self-pay

## 2017-10-08 ENCOUNTER — Emergency Department (HOSPITAL_COMMUNITY): Payer: BLUE CROSS/BLUE SHIELD

## 2017-10-08 ENCOUNTER — Emergency Department (HOSPITAL_COMMUNITY)
Admission: EM | Admit: 2017-10-08 | Discharge: 2017-10-08 | Disposition: A | Discharge status: Home / Self Care | Payer: BLUE CROSS/BLUE SHIELD | Attending: Emergency Medicine | Admitting: Emergency Medicine

## 2017-10-08 DIAGNOSIS — I10 Essential (primary) hypertension: Secondary | ICD-10-CM | POA: Diagnosis not present

## 2017-10-08 DIAGNOSIS — G43109 Migraine with aura, not intractable, without status migrainosus: Secondary | ICD-10-CM | POA: Diagnosis not present

## 2017-10-08 DIAGNOSIS — R202 Paresthesia of skin: Secondary | ICD-10-CM | POA: Diagnosis present

## 2017-10-08 LAB — CBC WITH DIFFERENTIAL/PLATELET
BASOS PCT: 0 %
Basophils Absolute: 0 10*3/uL (ref 0.0–0.1)
EOS ABS: 0.3 10*3/uL (ref 0.0–0.7)
Eosinophils Relative: 3 %
HCT: 34.3 % — ABNORMAL LOW (ref 36.0–46.0)
HEMOGLOBIN: 9.9 g/dL — AB (ref 12.0–15.0)
Lymphocytes Relative: 25 %
Lymphs Abs: 2.8 10*3/uL (ref 0.7–4.0)
MCH: 17.5 pg — AB (ref 26.0–34.0)
MCHC: 28.9 g/dL — ABNORMAL LOW (ref 30.0–36.0)
MCV: 60.7 fL — ABNORMAL LOW (ref 78.0–100.0)
MONO ABS: 0.6 10*3/uL (ref 0.1–1.0)
Monocytes Relative: 5 %
NEUTROS PCT: 67 %
Neutro Abs: 7.6 10*3/uL (ref 1.7–7.7)
Platelets: 389 10*3/uL (ref 150–400)
RBC: 5.65 MIL/uL — ABNORMAL HIGH (ref 3.87–5.11)
RDW: 19.3 % — ABNORMAL HIGH (ref 11.5–15.5)
WBC: 11.3 10*3/uL — ABNORMAL HIGH (ref 4.0–10.5)

## 2017-10-08 LAB — BASIC METABOLIC PANEL
ANION GAP: 7 (ref 5–15)
BUN: 10 mg/dL (ref 6–20)
CALCIUM: 8.8 mg/dL — AB (ref 8.9–10.3)
CO2: 23 mmol/L (ref 22–32)
Chloride: 107 mmol/L (ref 101–111)
Creatinine, Ser: 0.93 mg/dL (ref 0.44–1.00)
Glucose, Bld: 96 mg/dL (ref 65–99)
POTASSIUM: 3.7 mmol/L (ref 3.5–5.1)
Sodium: 137 mmol/L (ref 135–145)

## 2017-10-08 MED ORDER — SODIUM CHLORIDE 0.9 % IV BOLUS
500.0000 mL | Freq: Once | INTRAVENOUS | Status: AC
  Administered 2017-10-08: 500 mL via INTRAVENOUS

## 2017-10-08 MED ORDER — DEXAMETHASONE SODIUM PHOSPHATE 10 MG/ML IJ SOLN
10.0000 mg | Freq: Once | INTRAMUSCULAR | Status: AC
Start: 2017-10-08 — End: 2017-10-08
  Administered 2017-10-08: 10 mg via INTRAVENOUS
  Filled 2017-10-08: qty 1

## 2017-10-08 MED ORDER — DIPHENHYDRAMINE HCL 50 MG/ML IJ SOLN
50.0000 mg | Freq: Once | INTRAMUSCULAR | Status: AC
  Administered 2017-10-08: 50 mg via INTRAVENOUS
  Filled 2017-10-08: qty 1

## 2017-10-08 MED ORDER — KETOROLAC TROMETHAMINE 30 MG/ML IJ SOLN
30.0000 mg | Freq: Once | INTRAMUSCULAR | Status: AC
  Administered 2017-10-08: 30 mg via INTRAVENOUS
  Filled 2017-10-08: qty 1

## 2017-10-08 MED ORDER — IBUPROFEN 800 MG PO TABS: 800.0000 mg | ORAL_TABLET | Freq: Three times a day (TID) | ORAL | 0 refills | Status: AC | PRN

## 2017-10-08 MED ORDER — METOCLOPRAMIDE HCL 5 MG/ML IJ SOLN
10.0000 mg | Freq: Once | INTRAMUSCULAR | Status: AC
  Administered 2017-10-08: 10 mg via INTRAVENOUS
  Filled 2017-10-08: qty 2

## 2017-10-08 MED ORDER — PROCHLORPERAZINE MALEATE 10 MG PO TABS: 10.0000 mg | ORAL_TABLET | Freq: Two times a day (BID) | ORAL | 0 refills | Status: AC | PRN

## 2017-10-08 NOTE — Discharge Instructions (Signed)

## 2017-10-08 NOTE — ED Notes (Signed)
Pt discharged from ED; instructions provided and scripts given; Pt encouraged to return to ED if symptoms worsen and to f/u with PCP; Pt verbalized understanding of all instructions 

## 2017-10-08 NOTE — ED Provider Notes (Signed)
Emergency Department Provider Note   I have reviewed the triage vital signs and the nursing notes.   HISTORY  Chief Complaint Numbness   HPI Kristen Tate is a 37 y.o. female with PMH of lupus, HTN, and anemia presents to the emergency department for evaluation of to need headache.  The patient was evaluated in the emergency department 2 days ago.  At that time she was having left arm and leg numbness symptoms with associated vision disturbance that is typical of her migraine headaches.  She recently moved here from [place] and followed a neurologist there.  She denies any prior history of numbness or weakness with migraine headaches.  The patient had CT imaging and was ultimately recommended to come to [facility] for MRI and admission to rule out stroke.  She left AGAINST MEDICAL ADVICE after being evaluated by the hospitalist.  She states upon returning home her typical migraine headache worsened and the left-sided numbness decreased.  She still feels some heaviness throughout her entire body and has light sensitivity.  She denies any fevers or chills.  No sudden onset, maximal intensity headache symptoms.  She is no longer having left-sided numbness.   Past Medical History:  Diagnosis Date  . Anemia   . Hypertension   . Lupus anticoagulant disorder [facility])     Patient Active Problem List   Diagnosis Date Noted  . Left sided numbness 10/06/2017  . Hypertensive urgency 10/06/2017  . Chest pain 10/06/2017  . Microcytic hypochromic anemia 10/06/2017  . Lupus anticoagulant disorder (HCC) 10/06/2017  . Hypocalcemia 10/06/2017  . Obesity (BMI 30-39.9) 10/06/2017    Past Surgical History:  Procedure Laterality Date  . BREAST REDUCTION SURGERY    . CHOLECYSTECTOMY    . OVARIAN CYST REMOVAL    . scar tissue removed       Current Outpatient Rx  . Order #: [id number] Class: Historical Med  . Order #: [id number] Class: Historical Med  . Order #: [id number] Class: Print  . Order #:  [id number] Class: Print  . Order #: [id number] Class: Print  . Order #: [id number] Class: Print  . Order #: [id number] Class: Print    Allergies Penicillins  Family History  Problem Relation Age of Onset  . Cancer Mother        leiomyosarcoma  . Hypertension Mother   . Diabetes Father   . Hypertension Father   . CVA Father 31    Social History Social History   Tobacco Use  . Smoking status: Never Smoker  . Smokeless tobacco: Never Used  Substance Use Topics  . Alcohol use: No  . Drug use: No    Review of Systems  Constitutional: No fever/chills Eyes: No visual changes. Positive photophobia.  ENT: No sore throat. Cardiovascular: Denies chest pain. Respiratory: Denies shortness of breath. Gastrointestinal: No abdominal pain.  No nausea, no vomiting.  No diarrhea.  No constipation. Genitourinary: Negative for dysuria. Musculoskeletal: Negative for back pain. Skin: Negative for rash. Neurological: Negative for focal weakness. Positive left sided numbness (resolved). Positive HA.   10-point ROS otherwise negative.  ____________________________________________   PHYSICAL EXAM:  VITAL SIGNS: ED Triage Vitals  Enc Vitals Group     BP 10/08/17 1208 (!) 179/118     Pulse Rate 10/08/17 1208 89     Resp 10/08/17 1208 16     Temp 10/08/17 1208 98.4 F (36.9 C)     Temp Source 10/08/17 1208 Oral     SpO2 10/08/17 1208 100 %     Weight  10/08/17 1208 235 lb (106.6 kg)     Height 10/08/17 1208  (1.778 m)     Pain Score 10/08/17 1219 6   Constitutional: Alert and oriented. Well appearing and in no acute distress. Eyes: Conjunctivae are normal. PERRL. EOMI.  Head: Atraumatic. Nose: No congestion/rhinnorhea. Mouth/Throat: Mucous membranes are moist.  Neck: No stridor.   Cardiovascular: Normal rate, regular rhythm. Good peripheral circulation. Grossly normal heart sounds.   Respiratory: Normal respiratory effort.  No retractions. Lungs CTAB. Gastrointestinal: Soft and  nontender. No distention.  Musculoskeletal: No lower extremity tenderness nor edema. No gross deformities of extremities. Neurologic:  Normal speech and language. No gross focal neurologic deficits are appreciated. No pronator drift. Normal CN exam 2-12.  Skin:  Skin is warm, dry and intact. No rash noted.  ____________________________________________   LABS (all labs ordered are listed, but only abnormal results are displayed)  Labs Reviewed  CBC WITH DIFFERENTIAL/PLATELET - Abnormal; Notable for the following components:      Result Value   WBC 11.3 (*)    RBC 5.65 (*)    Hemoglobin 9.9 (*)    HCT 34.3 (*)    MCV 60.7 (*)    MCH 17.5 (*)    MCHC 28.9 (*)    RDW 19.3 (*)    All other components within normal limits  BASIC METABOLIC PANEL - Abnormal; Notable for the following components:   Calcium 8.8 (*)    All other components within normal limits   ____________________________________________  EKG   EKG Interpretation  Date/Time:  Friday Oct 08 2017 12:09:55 EDT Ventricular Rate:  97 PR Interval:  164 QRS Duration: 78 QT Interval:  360 QTC Calculation: 457 R Axis:   30 Text Interpretation:  Normal sinus rhythm Anterior infarct , age undetermined Abnormal ECG No STEMI.  Confirmed by Alona Bene [phone]) on 10/08/2017 11:18:05 PM       ____________________________________________  RADIOLOGY  Mr Brain Wo Contrast  Result Date: 10/08/2017 CLINICAL DATA:  Dizziness and left-sided numbness beginning 2-3 hours ago. EXAM: MRI HEAD WITHOUT CONTRAST TECHNIQUE: Multiplanar, multiecho pulse sequences of the brain and surrounding structures were obtained without intravenous contrast. COMPARISON:  CT studies 10/06/2017 FINDINGS: Brain: Brain has normal appearance without evidence of malformation, atrophy, old or acute small or large vessel infarction, mass lesion, hemorrhage, hydrocephalus or extra-axial collection. Vascular: Major vessels at the base of the brain show flow.  Venous sinuses appear patent. Skull and upper cervical spine: Normal. Sinuses/Orbits: Clear/normal. Small mastoid effusions, possibly subclinical. Other: None significant. IMPRESSION: Normal examination. No cause of the presenting symptoms is identified. Small mastoid effusions, possibly incidental. Electronically Signed   By: Paulina Fusi M.D.   On: 10/08/2017 14:09    ____________________________________________   PROCEDURES  Procedure(s) performed:   Procedures  None ____________________________________________   INITIAL IMPRESSION / ASSESSMENT AND PLAN / ED COURSE  Pertinent labs & imaging results that were available during my care of the patient were reviewed by me and considered in my medical decision making (see chart for details).  Patient presents to the emergency department for evaluation of headache and neuro symptoms.  She left AGAINST MEDICAL ADVICE 2 days ago after presenting with similar complaints.  Suspect complex migraine.  She had an MRI today which showed no acute infarct or other abnormality.  Plan to treat headache symptoms.  I will discussed the case with neurology on-call but suspect that the patient can be discharged if headache improved for outpatient neurology follow-up.  Spoke with  Dr. Otelia Limes. With normal MRI and migraine HA would not admit for further testing. Plan for HA mgmt and outpatient Neuro follow up.   On re-evaluation the patient is feeling better. Will discharge home with plan for outpatient neuro f/u. Compazine and Motrin for mgmt.   At this time, I do not feel there is any life-threatening condition present. I have reviewed and discussed all results (EKG, imaging, lab, urine as appropriate), exam findings with patient. I have reviewed nursing notes and appropriate previous records.  I feel the patient is safe to be discharged home without further emergent workup. Discussed usual and customary return precautions. Patient and family (if present)  verbalize understanding and are comfortable with this plan.  Patient will follow-up with their primary care provider. If they do not have a primary care provider, information for follow-up has been provided to them. All questions have been answered.  ____________________________________________  FINAL CLINICAL IMPRESSION(S) / ED DIAGNOSES  Final diagnoses:  Migraine with aura and without status migrainosus, not intractable     MEDICATIONS GIVEN DURING THIS VISIT:  Medications  ketorolac (TORADOL) 30 MG/ML injection 30 mg (30 mg Intravenous Given 10/08/17 1859)  metoCLOPramide (REGLAN) injection 10 mg (10 mg Intravenous Given 10/08/17 1859)  diphenhydrAMINE (BENADRYL) injection 50 mg (50 mg Intravenous Given 10/08/17 1859)  dexamethasone (DECADRON) injection 10 mg (10 mg Intravenous Given 10/08/17 1859)  sodium chloride 0.9 % bolus 500 mL (0 mLs Intravenous Stopped 10/08/17 2000)     NEW OUTPATIENT MEDICATIONS STARTED DURING THIS VISIT:  Discharge Medication List as of 10/08/2017  8:12 PM    START taking these medications   Details  prochlorperazine (COMPAZINE) 10 MG tablet Take 1 tablet (10 mg total) by mouth 2 (two) times daily as needed for vomiting (migraine headache)., Starting Fri 10/08/2017, Print        Note:  This document was prepared using Dragon voice recognition software and may include unintentional dictation errors.  Alona Bene, MD Emergency Medicine    Marly Schuld, Arlyss Repress, MD 10/08/17 [phone]

## 2019-06-27 IMAGING — CT CT ANGIO HEAD
2 of 9 series · 7 of 33 positions shown · IV contrast (ISOVUE)
Comparison: Head CT same day

CLINICAL DATA: Dizziness and left-sided numbness beginning 6 hours
ago

EXAM:
CT ANGIOGRAPHY HEAD AND NECK
TECHNIQUE: Multidetector CT imaging of the head and neck was performed using
the standard protocol during bolus administration of intravenous
contrast. Multiplanar CT image reconstructions and MIPs were
obtained to evaluate the vascular anatomy. Carotid stenosis
measurements (when applicable) are obtained utilizing NASCET
criteria, using the distal internal carotid diameter as the
denominator.
CONTRAST:  80mL [id number]-KVF IOPAMIDOL (KJD9G9-KVF) INJECTION 76%

[Series 7: cta head neck thins · axial · 0.59mm/px · z∈[+452,+672]mm · 5 of 659 slices shown]
[im 110/659  soft-tissue]
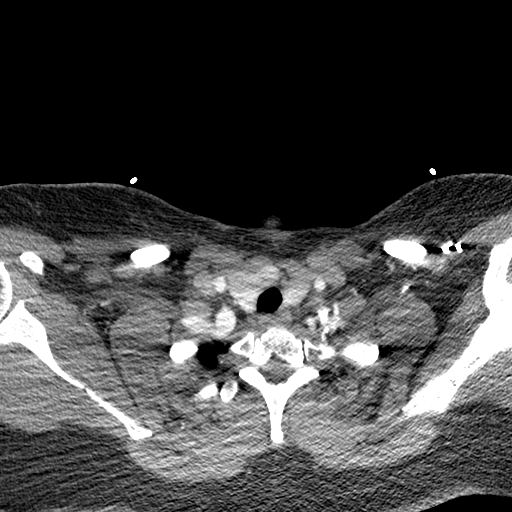
[im 220/659  bone]
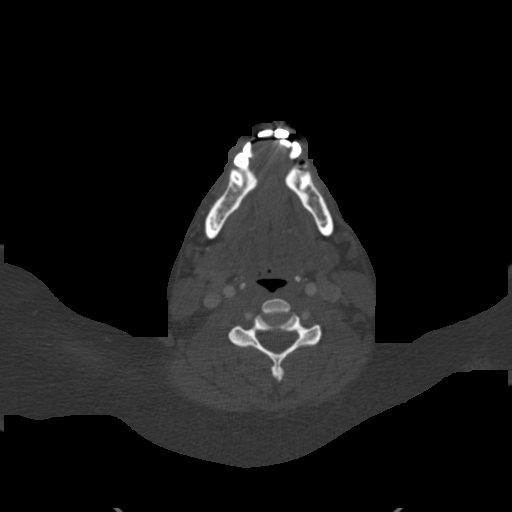
[im 330/659  soft-tissue]
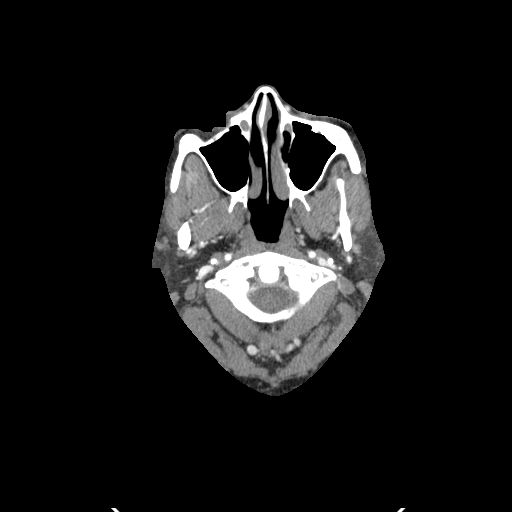
[im 439/659  bone]
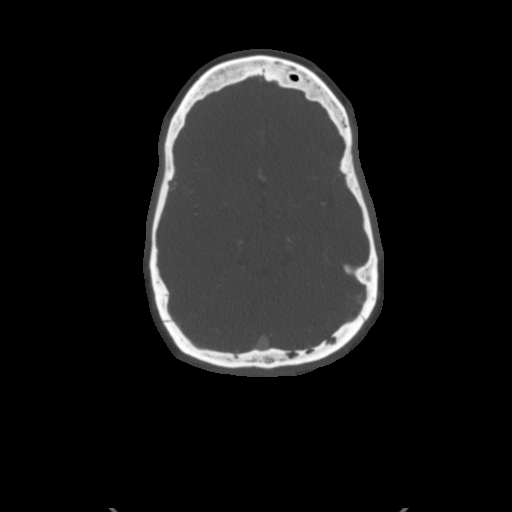
[im 549/659  soft-tissue]
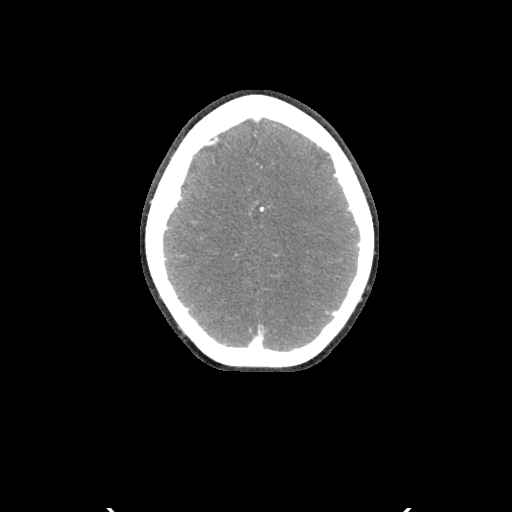

[Series 8: ax thin · axial · 0.59mm/px · z∈[+506,+616]mm · 2 of 330 slices shown]
[im 110/330  soft-tissue]
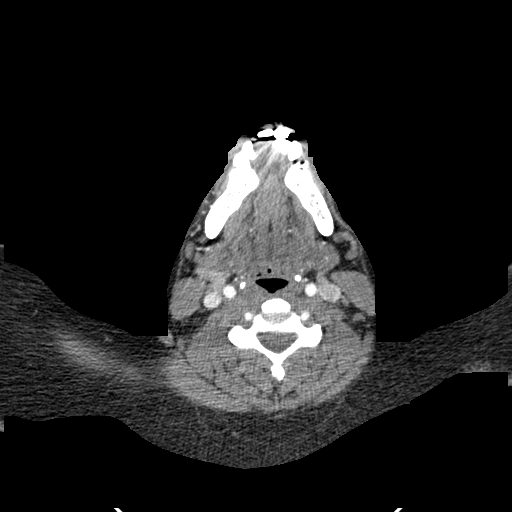
[im 220/330  soft-tissue]
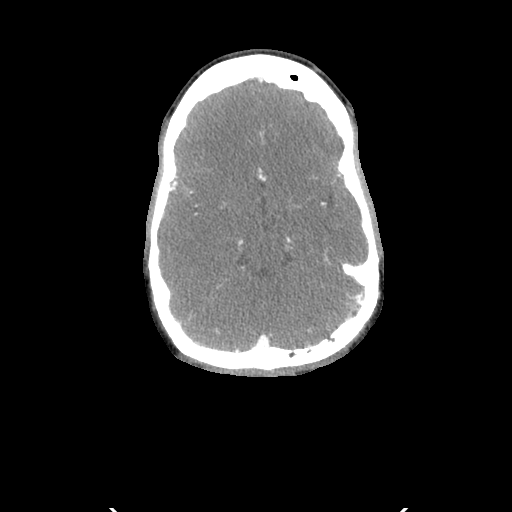

[7 of 33 positions shown; findings below may reference images not displayed]

FINDINGS: CTA NECK FINDINGS

Aortic arch: Normal

Right carotid system: Common carotid artery is normal. Carotid
bifurcation is normal. Cervical ICA is normal.

Left carotid system: Common carotid artery is normal. Carotid
bifurcation is normal. Cervical ICA is normal.

Vertebral arteries: Both vertebral artery origins are widely patent.
Both vertebral arteries are approximately equal in size an widely
patent through the cervical region.

Skeleton: Normal

Other neck: No neck lesion.

Upper chest: Clear

Review of the MIP images confirms the above findings

CTA HEAD FINDINGS

Anterior circulation: Both internal carotid arteries are widely
patent through the skull base and siphon regions. The anterior and
middle cerebral vessels are patent without proximal stenosis,
aneurysm or vascular malformation.

Posterior circulation: Both vertebral arteries are patent to the
basilar. No basilar stenosis. Posterior circulation branch vessels
are normal.

Venous sinuses: Patent and normal.

Anatomic variants: None significant

Delayed phase: No abnormal enhancement

Review of the MIP images confirms the above findings
IMPRESSION: Normal CT angiography examinations of the neck and head. No
abnormality seen to explain the presenting symptoms.
